# Patient Record
Sex: Male | Born: 1965 | Race: White | Hispanic: No | Marital: Single | Smoking: Never smoker
Health system: Southern US, Academic
[De-identification: ages and names within clinical notes are randomized; demographics above are authoritative.]

## PROBLEM LIST (undated history)

## (undated) DIAGNOSIS — Z87442 Personal history of urinary calculi: Secondary | ICD-10-CM

## (undated) DIAGNOSIS — I10 Essential (primary) hypertension: Secondary | ICD-10-CM

## (undated) DIAGNOSIS — K4090 Unilateral inguinal hernia, without obstruction or gangrene, not specified as recurrent: Secondary | ICD-10-CM

## (undated) HISTORY — PX: INGUINAL HERNIA REPAIR: SUR1180

## (undated) HISTORY — PX: COLONOSCOPY: WVUENDOPRO10

## (undated) HISTORY — DX: Personal history of urinary calculi: Z87.442

## (undated) HISTORY — DX: Unilateral inguinal hernia, without obstruction or gangrene, not specified as recurrent: K40.90

## (undated) HISTORY — DX: Essential (primary) hypertension: I10

---

## 2020-03-15 ENCOUNTER — Ambulatory Visit (HOSPITAL_COMMUNITY): Payer: Self-pay | Admitting: Surgery

## 2021-11-23 IMAGING — MR MRI SHOULDER LT W/O CONTRAST
6 of 7 series · 34 of 40 positions shown · non-contrast
Comparison: None previous.

﻿EXAM:  [id number]   MRI SHOULDER LT W/O CONTRAST
INDICATION: 55-year old male with persistent left shoulder pain and diminished range of motion after sustaining trauma due to fall in May.  No history of shoulder surgery.
TECHNIQUE: Axial, oblique coronal and oblique sagittal images of the left shoulder were obtained as per protocol.

[Series 10: T1 · oblique · left · 4.0mm · 0.31mm/px · 7 of 22 slices shown]
[im 1/22]
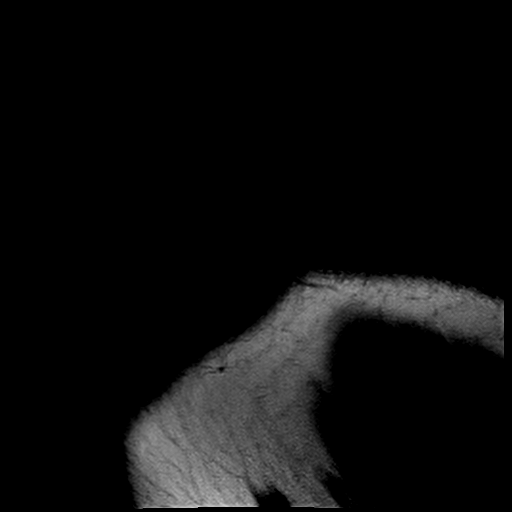
[im 4/22]
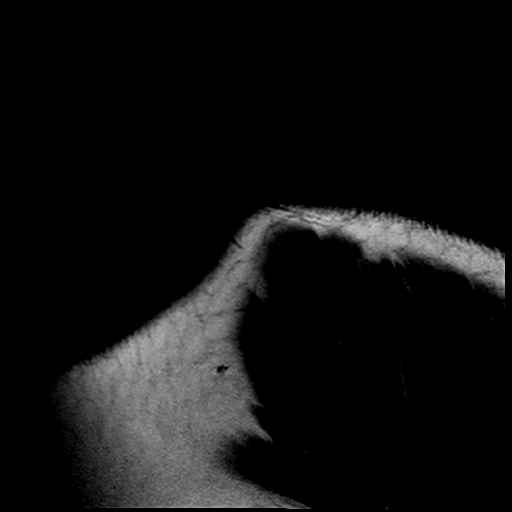
[im 8/22]
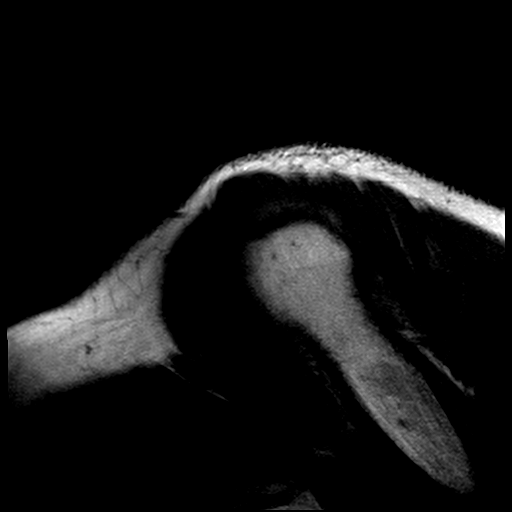
[im 11/22]
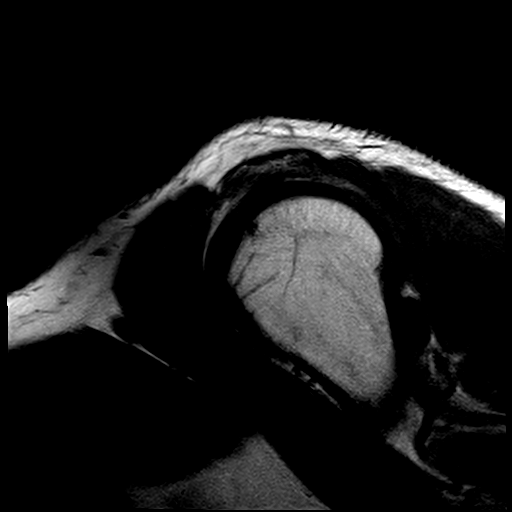
[im 15/22]
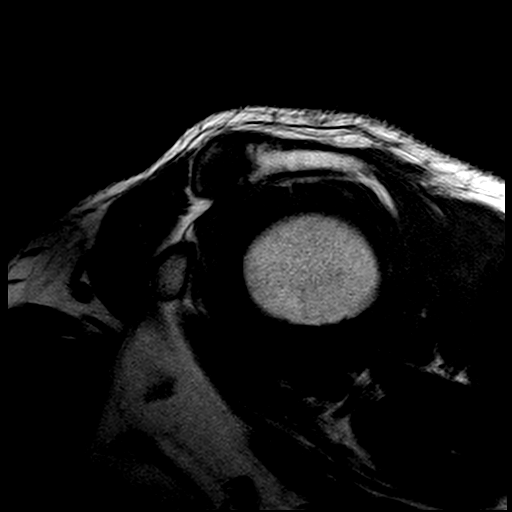
[im 18/22]
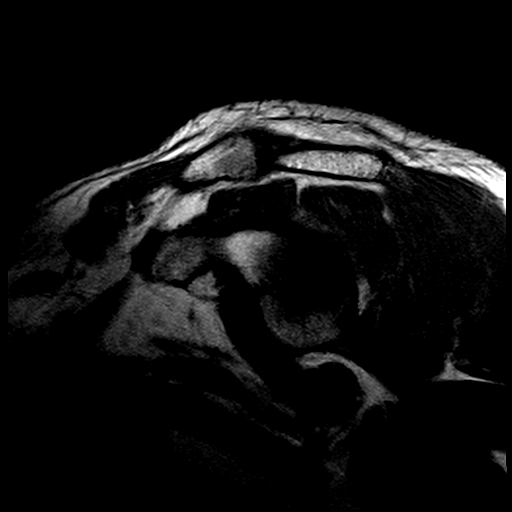
[im 22/22]
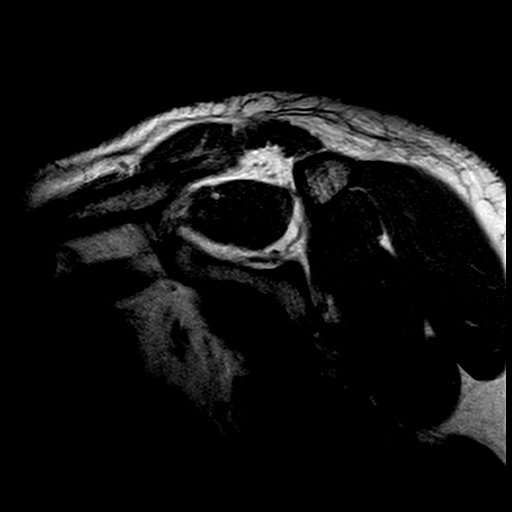

[Series 11: T2 fat-sat · axial · left · 4.0mm · 0.42mm/px · z∈[-92,+11]mm · 7 of 24 slices shown]
[im 1/24]
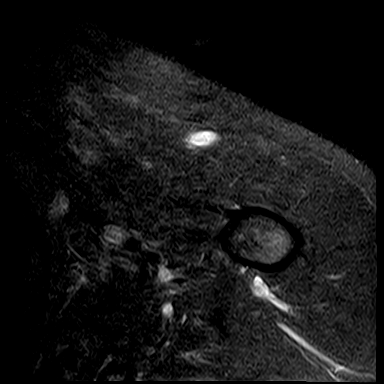
[im 4/24]
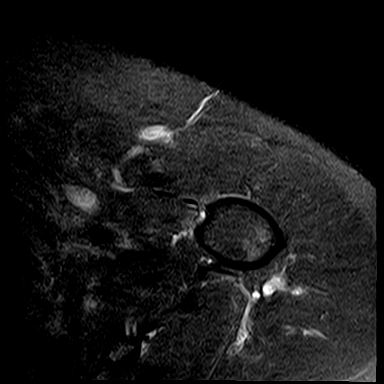
[im 8/24]
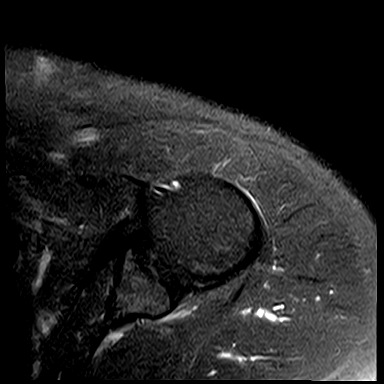
[im 12/24]
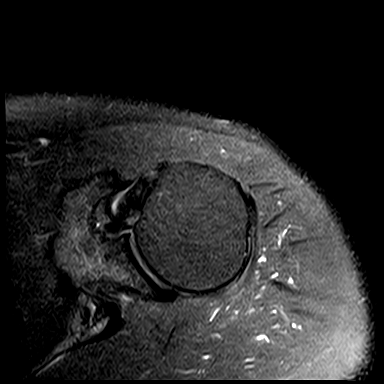
[im 16/24]
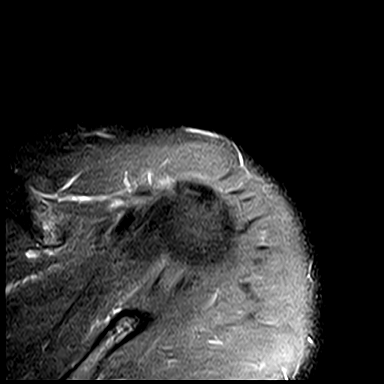
[im 20/24]
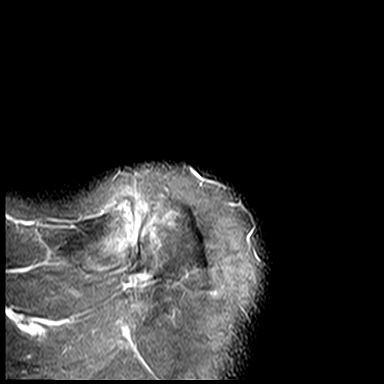
[im 24/24]
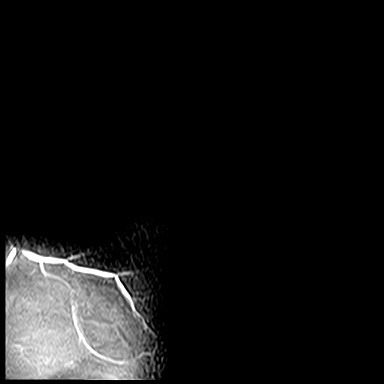

[Series 12: PD fat-sat · axial · left · 4.0mm · 0.50mm/px · z∈[-78,-2]mm · 5 of 18 slices shown (1 of 2)]
[im 1/18]
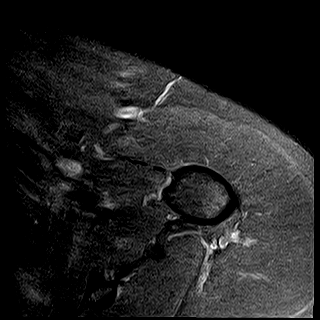
[im 5/18]
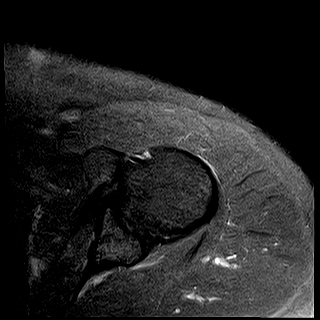
[im 9/18]
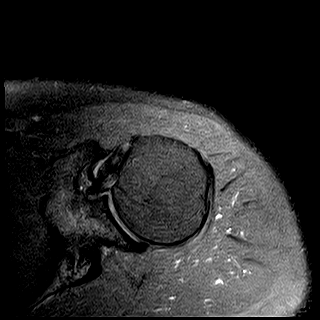
[im 13/18]
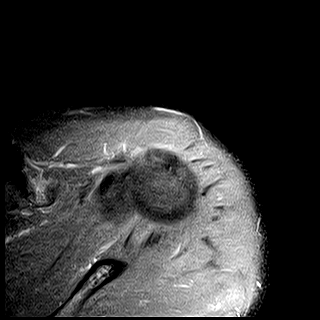
[im 18/18]
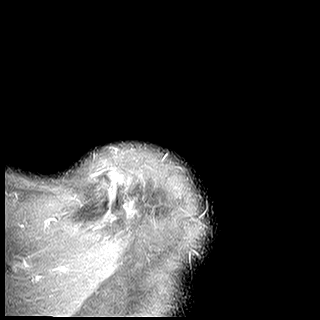

[Series 13: PD fat-sat · oblique · left · 3.5mm · 0.47mm/px · 5 of 18 slices shown (2 of 2)]
[im 1/18]
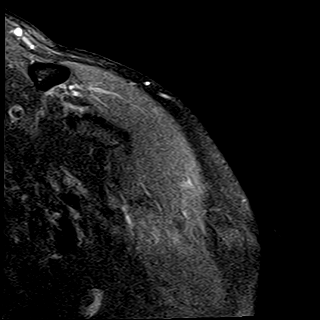
[im 5/18]
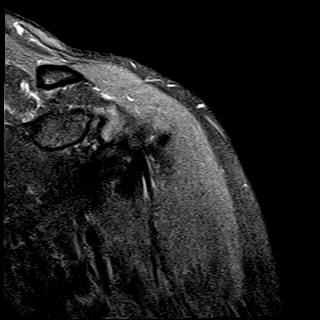
[im 9/18]
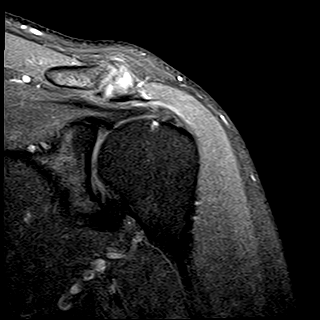
[im 13/18]
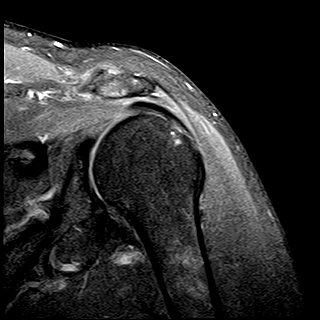
[im 18/18]
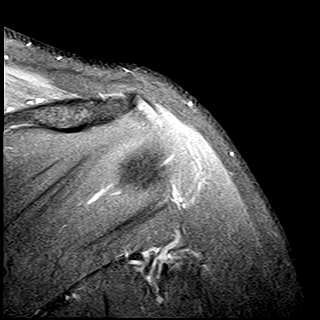

[Series 14: STIR · oblique · left · 3.5mm · 0.47mm/px · 4 of 18 slices shown]
[im 1/18]
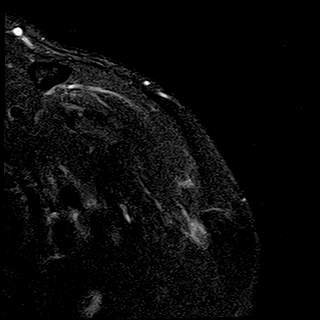
[im 5/18]
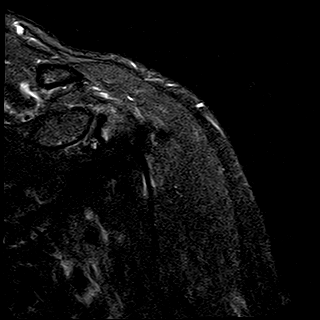
[im 9/18]
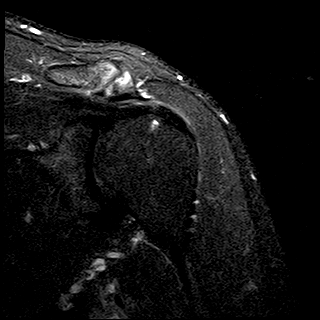
[im 13/18]
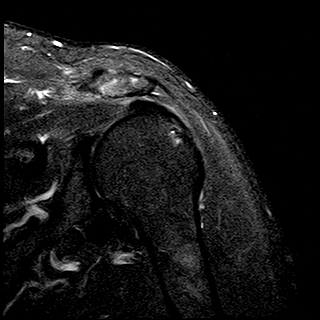

[Series 16: PD · oblique · left · 3.5mm · 0.47mm/px · 6 of 22 slices shown]
[im 1/22]
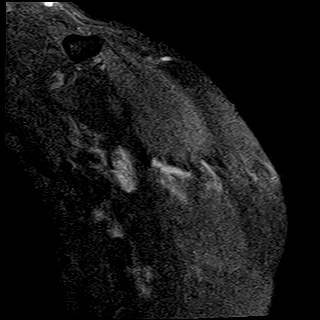
[im 5/22]
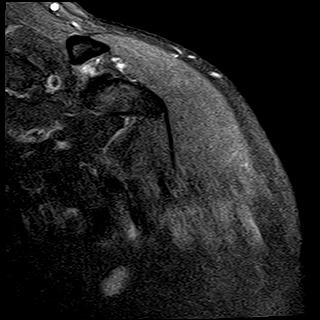
[im 9/22]
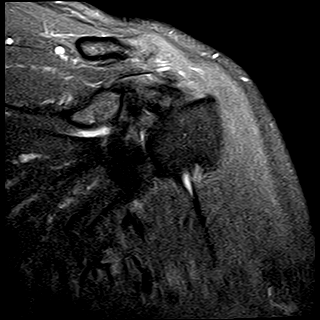
[im 13/22]
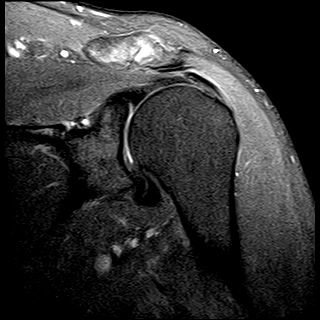
[im 17/22]
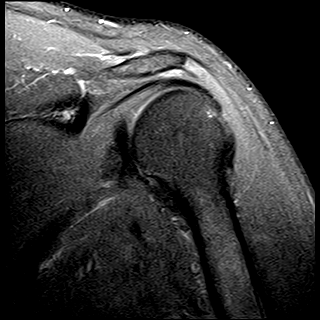
[im 22/22]
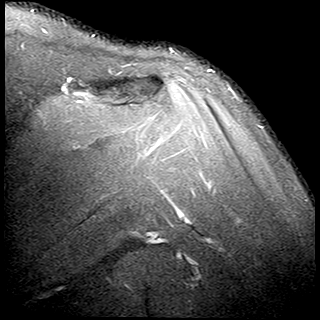

[34 of 40 positions shown; findings below may reference images not displayed]

FINDINGS: No acute fractures are seen at the left shoulder.  Degenerative changes of acromioclavicular joint with moderate inflammatory changes on both sides of the joint mildly impinges on the subacromion space in the supraspinatus.  Supraspinatus tendinopathy with shallow partial thickness tear on the bursal side of the supraspinatus is noted.  No full thickness tear are seen.  

Glenoid labrum and long head of biceps tendon are intact.
IMPRESSION: 1. No acute bony lesions at the left shoulder.  

2. Degenerative changes and inflammatory changes of AC joint mildly impinging on the supraspinatus in the subacromion region.  

3. Supraspinatus tendinopathy with shallow partial thickness tear on the bursal side.  No full thickness tear are seen.  

4. Glenoid labrum and biceps tendon are intact.

## 2022-05-01 IMAGING — MR MRI THORACIC SPINE WITHOUT CONTRAST
4 of 5 series · 26 of 48 positions shown · IV contrast (gadolinium)
Comparison: None available.

﻿EXAM:  [id number]   MRI THORACIC SPINE WITHOUT CONTRAST
INDICATION: Trauma due to fall in September 2021.  Mid back pain.  Radiculopathy in the thoracic region.
TECHNIQUE: Multiplanar, multisequential MRI of the thoracic spine was performed without gadolinium contrast.

[Series 5: T2 · sagittal · 4.0mm · 0.78mm/px · 8 of 13 slices shown (1 of 2)]
[im 1/13]
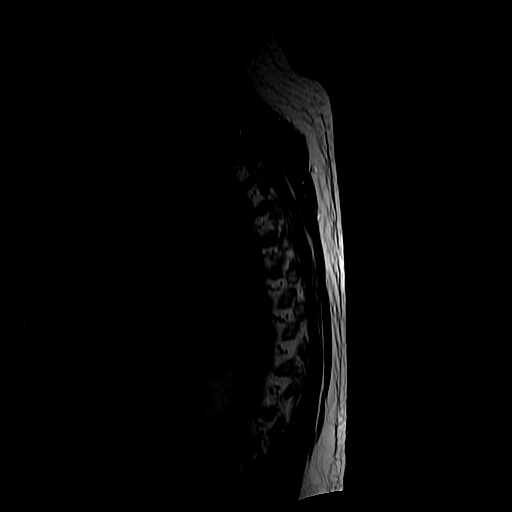
[im 2/13]
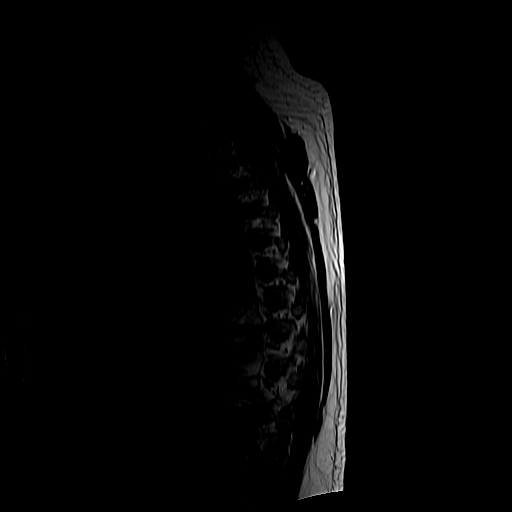
[im 5/13]
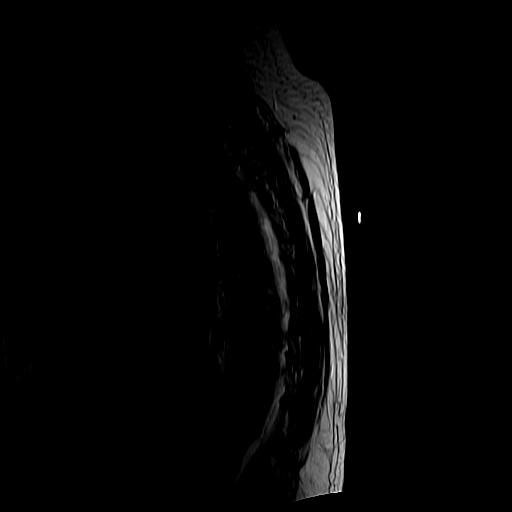
[im 6/13]
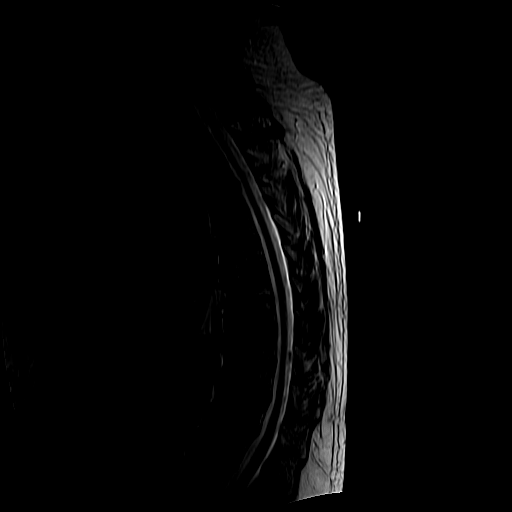
[im 7/13]
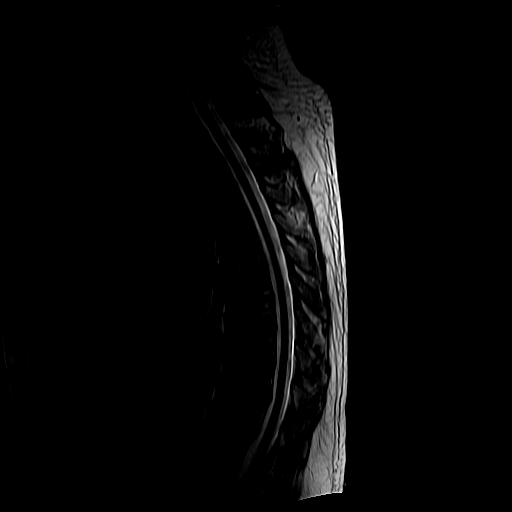
[im 9/13]
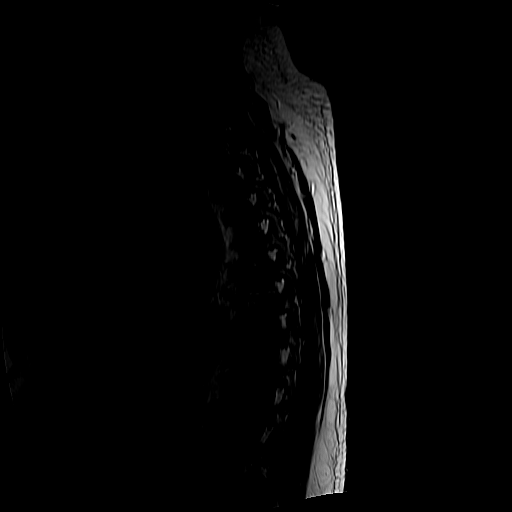
[im 11/13]
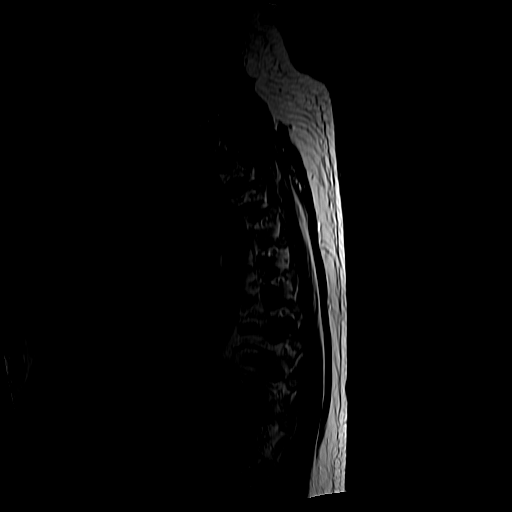
[im 13/13]
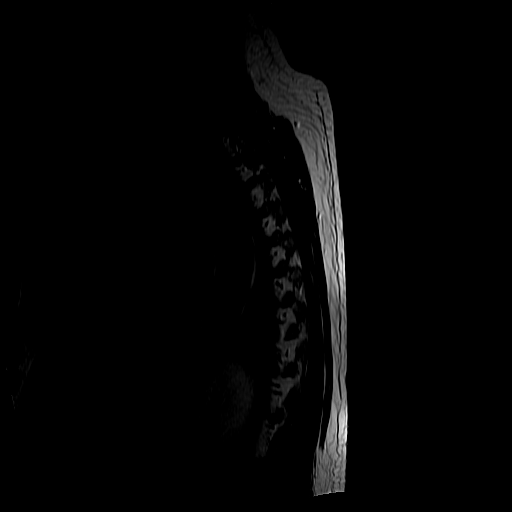

[Series 6: T1 · sagittal · 4.0mm · 0.78mm/px · 6 of 13 slices shown (1 of 2)]
[im 1/13]
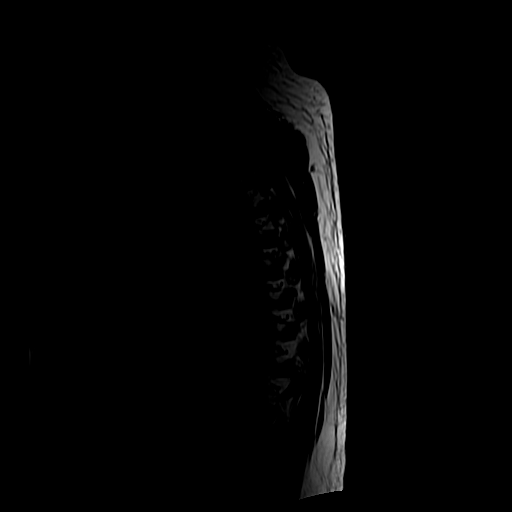
[im 2/13]
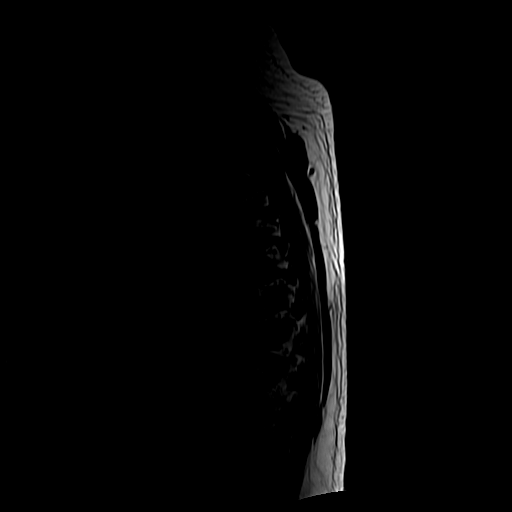
[im 5/13]
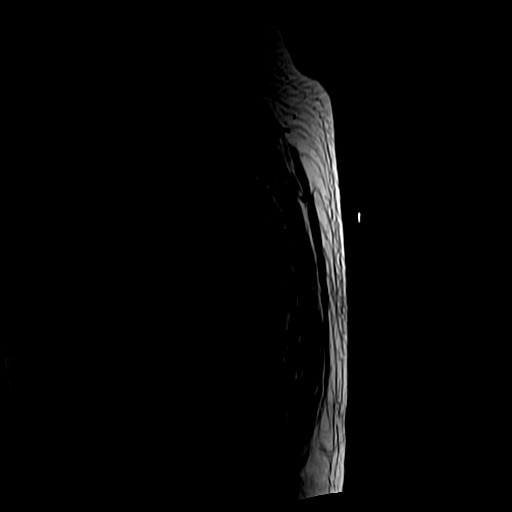
[im 6/13]
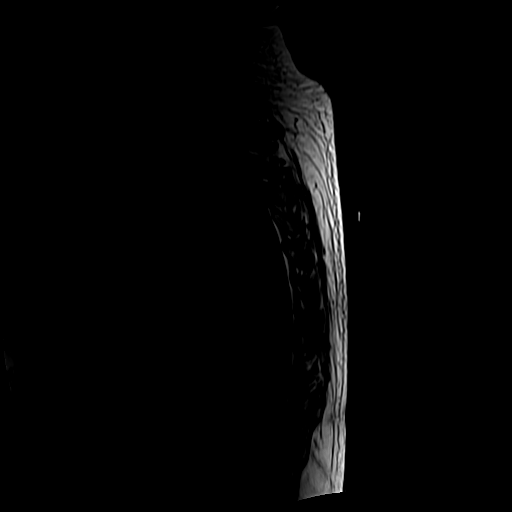
[im 7/13]
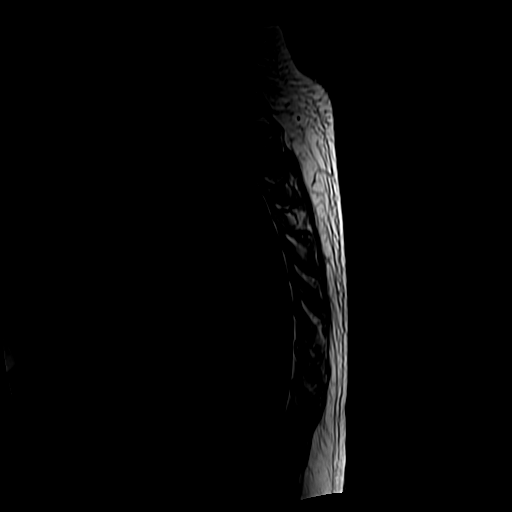
[im 11/13]
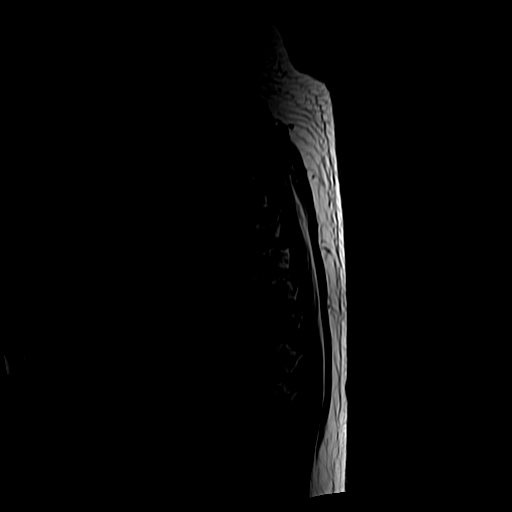

[Series 8: T2 · axial · 4.0mm · 0.62mm/px · z∈[-247,-85]mm · 9 of 12 slices shown (2 of 2)]
[im 1/12]
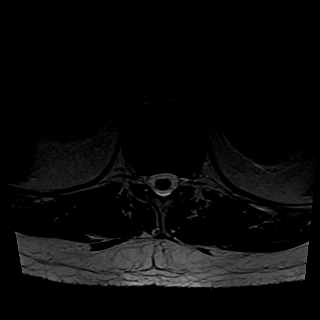
[im 2/12]
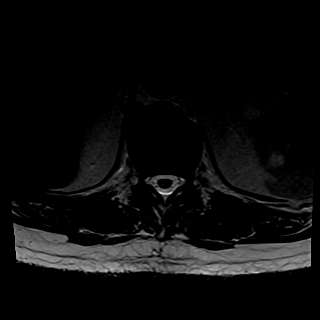
[im 3/12]
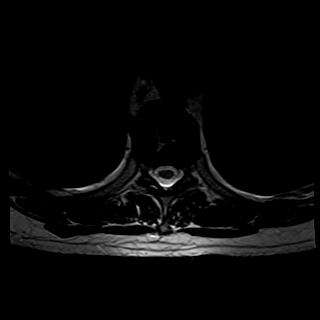
[im 5/12]
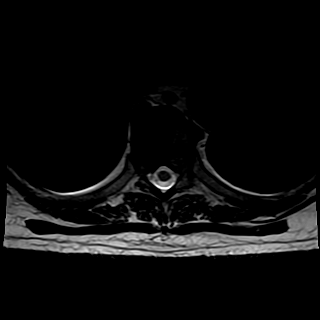
[im 6/12]
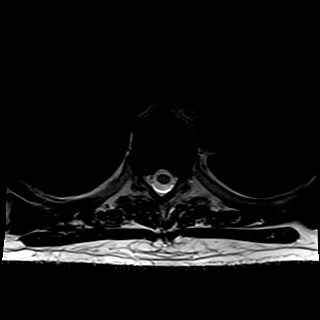
[im 7/12]
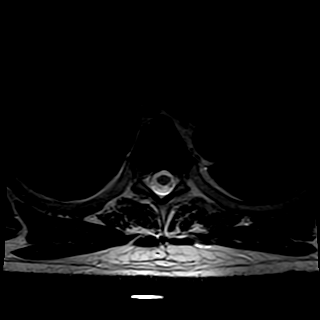
[im 9/12]
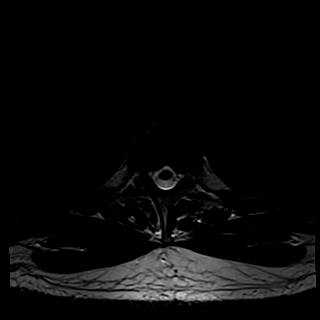
[im 10/12]
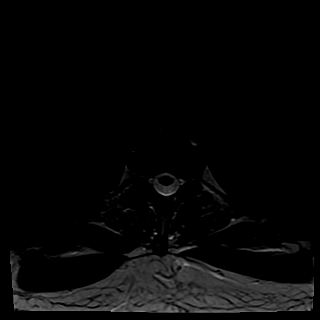
[im 12/12]
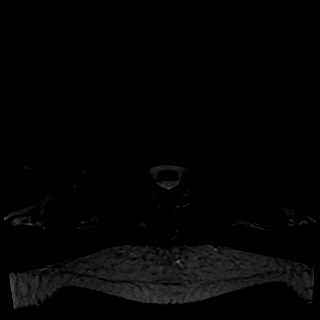

[Series 9: T1 · axial · 4.0mm · 0.62mm/px · z∈[-231,-120]mm · 3 of 12 slices shown (2 of 2)]
[im 2/12]
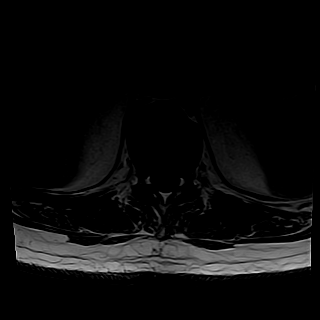
[im 6/12]
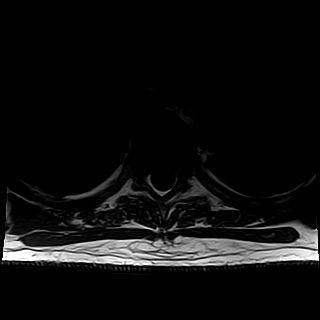
[im 10/12]
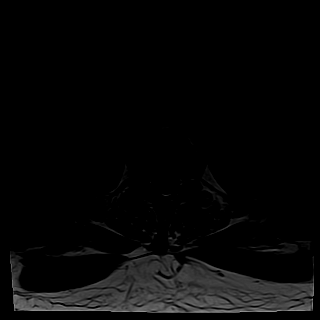

[26 of 48 positions shown; findings below may reference images not displayed]

FINDINGS: No acute bony lesions of thoracic vertebrae are seen.

No evidence of thoracic disc herniation or spinal stenosis is seen in the thoracic spine. 

Moderate degenerative disc changes in the lower thoracic spine extending from T8-9 level up to T12-L1 level are noted.  No significant compromise of neural foramina is seen in the thoracic region.

Thoracic spinal cord shows no focal abnormalities.  Paravertebral soft tissues do not show focal or acute findings.
IMPRESSION: 1. No acute fracture or bone marrow edema of thoracic spine.

2. Moderate multilevel degenerative disc changes in the lower thoracic spine as mentioned above from T8-9 level up to T12-L1 level.

3. No evidence of thoracic spinal stenosis or foraminal stenosis is seen.  Thoracic spinal cord shows no focal abnormalities.

4. Above-mentioned findings are chronic in nature.

## 2022-06-04 ENCOUNTER — Other Ambulatory Visit: Payer: Self-pay

## 2022-06-04 ENCOUNTER — Encounter (RURAL_HEALTH_CENTER): Payer: Self-pay | Admitting: Family Medicine

## 2022-06-04 ENCOUNTER — Ambulatory Visit (RURAL_HEALTH_CENTER): Payer: BC Managed Care – PPO | Attending: Family Medicine | Admitting: Family Medicine

## 2022-06-04 VITALS — BP 140/90 | HR 75 | Temp 98.5°F | Resp 16 | Wt 255.0 lb

## 2022-06-04 DIAGNOSIS — M546 Pain in thoracic spine: Secondary | ICD-10-CM | POA: Insufficient documentation

## 2022-06-04 DIAGNOSIS — M12819 Other specific arthropathies, not elsewhere classified, unspecified shoulder: Secondary | ICD-10-CM

## 2022-06-04 DIAGNOSIS — M12812 Other specific arthropathies, not elsewhere classified, left shoulder: Secondary | ICD-10-CM | POA: Insufficient documentation

## 2022-06-04 DIAGNOSIS — R972 Elevated prostate specific antigen [PSA]: Secondary | ICD-10-CM

## 2022-06-04 DIAGNOSIS — I10 Essential (primary) hypertension: Secondary | ICD-10-CM

## 2022-06-04 MED ORDER — VALSARTAN 80 MG TABLET: ORAL_TABLET | ORAL | 0 refills | Status: DC

## 2022-06-04 NOTE — Nursing Note (Signed)
Pt here to get established .  Nohelani Benning, LPN

## 2022-06-04 NOTE — Progress Notes (Signed)
[facility], [place]  [address]  [place] [address]  Operated by [facility]     Name: George Vincent. MRN:  [id number]   Date: 06/04/2022 Age: 57 y.o.          Provider: Lowella Grip, DO    Reason for visit: New Patient      History of Present Illness:  George Vincent. is a 57 y.o. male here today to establish care.    Essential Hypertension:     Worker's compesnsation Injuries:   Rotator cuff surgery:   Scheduled in March    Thoracic Sprain:   Dr. Milinda Cave in [place] - referred to pain management  If he is upright and not leaning against his back - no problems. If he rotates to the left he feels soreness on the right side. He states he if lays down on the floor he cannot get up on his own.   After sitting in his recliner at the end of the day when he sits in the recliner he is  Bed - goes to sleep ok but if he wakes up then he cannot get the soreness to go away.    Had MRI that was essentially "unremarkable"  05/01/2022: MRI Thoracic Spine:   No acute fracture or bone marrow edema of thoracic spine.   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  No evidence of thoracic spinal stenosis or foraminal stenosis is seen. Thoracic spinal cord shows no focal abnormalities.   Above-mentioned findings are chronic in nature.       Increase in PSA  0.79 to 2.49  He reports family history of cancer.     Labs from 05/08/2022:   Glucose 68  A1C 5.7  Bun 15/Cr 1.08  T. Bilirubin 0.53  Urine is negative  TC 186  HDL 42.2  LDL 129  TG 74        Historical Data    Past Medical History:  Past Medical History:   Diagnosis Date    History of kidney stones     Hypertension     Inguinal hernia          Past Surgical History:  Past Surgical History:   Procedure Laterality Date    COLONOSCOPY      Dr Georgiann Cocker    INGUINAL HERNIA REPAIR      right 03/2016, left 12/2020          Allergies:  No Known Allergies  Medications:  Current Outpatient Medications   Medication Sig    valsartan (DIOVAN) 80 mg Oral Tablet Take 1 Tablet (80 mg total) by mouth Twice daily Takes one tablet morning and 1/2 in evening     Family History:  Family Medical History:       Problem Relation (Age of Onset)    Brain cancer Mother    Diabetes Mother    Hypertension (High Blood Pressure) Mother, Father    Stroke Mother    Thyroid Disease Mother            Social History:  Social History     Socioeconomic History    Marital status: Single   Tobacco Use    Smoking status: Never    Smokeless tobacco: Never   Vaping Use    Vaping Use: Unknown   Substance and Sexual Activity    Alcohol use: Never  Drug use: Never           Review of Systems:  Review of Systems   Constitutional:  Negative for fatigue, fever and unexpected weight change.   Respiratory:  Negative for cough, chest tightness and shortness of breath.    Cardiovascular:  Negative for chest pain and leg swelling.   Gastrointestinal:  Negative for abdominal pain, constipation, diarrhea, nausea and vomiting.   Musculoskeletal:  Positive for arthralgias and back pain.   Neurological:  Negative for light-headedness and headaches.         Physical Exam:  Vital Signs:  Vitals:    06/04/22 1631 06/04/22 1653   BP: (!) 128/98 (!) 140/90  Comment: manual   Pulse: 75    Resp: 16    Temp: 36.9 C (98.5 F)    TempSrc: Skin    SpO2: 96%    Weight: 116 kg (255 lb)      Physical Exam  Vitals reviewed.   Constitutional:       General: He is not in acute distress.     Appearance: Normal appearance.   HENT:      Mouth/Throat:      Mouth: Mucous membranes are moist.   Eyes:      Pupils: Pupils are equal, round, and reactive to light.   Cardiovascular:      Rate and Rhythm: Normal rate and regular rhythm.      Pulses: Normal pulses.      Heart sounds: Normal heart sounds. No murmur heard.  Pulmonary:      Effort: Pulmonary effort is normal.      Breath sounds: Normal  breath sounds.   Abdominal:      General: Bowel sounds are normal.      Palpations: Abdomen is soft.      Tenderness: There is no abdominal tenderness. There is no guarding.   Musculoskeletal:         General: Tenderness (thoracic spine) present. No swelling or deformity.      Cervical back: Neck supple.   Lymphadenopathy:      Cervical: No cervical adenopathy.   Skin:     General: Skin is warm and dry.   Neurological:      General: No focal deficit present.      Mental Status: He is alert.             Assessment:    ICD-10-CM    1. Essential hypertension  I10 valsartan (DIOVAN) 80 mg Oral Tablet      2. Thoracic back pain  M54.6       3. Elevated PSA  R97.20       4. Rotator cuff arthropathy, unspecified laterality  M12.819             Plan:    Depression screening is negative. PHQ 2 Total: 0     1. Essential hypertension  Home BP monitoring reported at improved and readings from work physical also reviewed and improved compared to here. We will continue at current doses with home monitoring reported but if persistently elevated then we will need to look at increasing.     - valsartan (DIOVAN) 80 mg Oral Tablet; Takes one tablet morning and 1/2 in evening  Dispense: 180 Tablet; Refill: 0    2. Thoracic back pain  MRI reviewed. We will continue to monitor - discussed evaluation by chiropractor and/or OMT specialist if pain persists. Referral for pain management made by Dr. Milinda Cave.  3. Elevated PSA  Still within the normal limits but did demonstrate an increase. Will continue to monitor closely.     4. Rotator cuff arthropathy, unspecified laterality  Continue to follow with orthopedics.       Return in about 6 months (around 12/03/2022) for In Person Visit - CDM.    Lowella Grip, DO     Portions of this note may be dictated using voice recognition software or a dictation service. Variances in spelling and vocabulary are possible and unintentional. Not all errors are caught/corrected. Please notify the [facility] if any  discrepancies are noted or if the meaning of any statement is not clear.

## 2022-06-06 ENCOUNTER — Encounter (RURAL_HEALTH_CENTER): Payer: Self-pay | Admitting: Family Medicine

## 2022-06-06 ENCOUNTER — Other Ambulatory Visit (RURAL_HEALTH_CENTER): Payer: Self-pay | Admitting: Family Medicine

## 2022-06-07 MED ORDER — SILVER SULFADIAZINE 1 % TOPICAL CREAM
TOPICAL_CREAM | Freq: Every day | CUTANEOUS | 1 refills | Status: DC
Start: 2022-06-07 — End: 2022-08-06

## 2022-06-12 ENCOUNTER — Other Ambulatory Visit: Payer: Self-pay

## 2022-07-23 ENCOUNTER — Other Ambulatory Visit: Payer: Worker's Comp, Other unspecified | Attending: Orthopaedic Surgery

## 2022-07-23 ENCOUNTER — Inpatient Hospital Stay (HOSPITAL_BASED_OUTPATIENT_CLINIC_OR_DEPARTMENT_OTHER)
Admission: RE | Admit: 2022-07-23 | Discharge: 2022-07-23 | Disposition: A | Discharge status: Home / Self Care | Payer: Worker's Comp, Other unspecified | Source: Ambulatory Visit | Attending: Orthopaedic Surgery | Admitting: Orthopaedic Surgery

## 2022-07-23 ENCOUNTER — Other Ambulatory Visit: Payer: Self-pay

## 2022-07-23 ENCOUNTER — Other Ambulatory Visit (HOSPITAL_COMMUNITY): Payer: Self-pay | Admitting: Orthopaedic Surgery

## 2022-07-23 DIAGNOSIS — Z01818 Encounter for other preprocedural examination: Secondary | ICD-10-CM

## 2022-07-23 LAB — URINALYSIS, MACROSCOPIC
BILIRUBIN: NEGATIVE mg/dL
BLOOD: NEGATIVE mg/dL
GLUCOSE: NEGATIVE mg/dL
KETONES: NEGATIVE mg/dL
LEUKOCYTES: NEGATIVE WBCs/uL
NITRITE: NEGATIVE
PH: 5 (ref 5.0–9.0)
PROTEIN: NEGATIVE mg/dL
SPECIFIC GRAVITY: 1.016 (ref 1.002–1.030)
UROBILINOGEN: NORMAL mg/dL

## 2022-07-23 LAB — CBC WITH DIFF
BASOPHIL #: 0.1 10*3/uL (ref 0.00–0.10)
BASOPHIL %: 1 % (ref 0–1)
EOSINOPHIL #: 0.4 10*3/uL (ref 0.00–0.50)
EOSINOPHIL %: 5 %
HCT: 41.5 % (ref 36.7–47.1)
HGB: 14.4 g/dL (ref 12.5–16.3)
LYMPHOCYTE #: 2.9 10*3/uL (ref 1.00–3.00)
LYMPHOCYTE %: 36 % (ref 16–44)
MCH: 32.9 pg (ref 23.8–33.4)
MCHC: 34.8 g/dL (ref 32.5–36.3)
MCV: 94.5 fL (ref 73.0–96.2)
MONOCYTE #: 0.7 10*3/uL (ref 0.30–1.00)
MONOCYTE %: 9 % (ref 5–13)
MPV: 8.8 fL (ref 7.4–11.4)
NEUTROPHIL #: 4.2 10*3/uL (ref 1.85–7.80)
NEUTROPHIL %: 51 % (ref 43–77)
PLATELETS: 240 10*3/uL (ref 140–440)
RBC: 4.39 10*6/uL (ref 4.06–5.63)
RDW: 14.1 % (ref 12.1–16.2)
WBC: 8.2 10*3/uL (ref 3.6–10.2)

## 2022-07-23 LAB — BASIC METABOLIC PANEL
ANION GAP: 5 mmol/L (ref 4–13)
BUN/CREA RATIO: 14 (ref 6–22)
BUN: 15 mg/dL (ref 7–25)
CALCIUM: 8.9 mg/dL (ref 8.6–10.3)
CHLORIDE: 107 mmol/L (ref 98–107)
CO2 TOTAL: 29 mmol/L (ref 21–31)
CREATININE: 1.07 mg/dL (ref 0.60–1.30)
ESTIMATED GFR: 81 mL/min/{1.73_m2} (ref >59)
GLUCOSE: 100 mg/dL (ref 74–109)
OSMOLALITY, CALCULATED: 282 mOsm/kg (ref 270–290)
POTASSIUM: 3.7 mmol/L (ref 3.5–5.1)
SODIUM: 141 mmol/L (ref 136–145)

## 2022-07-23 LAB — URINALYSIS, MICROSCOPIC
RBCS: <1 /hpf (ref <4)
WBCS: 1 /hpf (ref <6)

## 2022-07-24 DIAGNOSIS — Z01810 Encounter for preprocedural cardiovascular examination: Secondary | ICD-10-CM

## 2022-07-24 LAB — ECG 12 LEAD
Atrial Rate: 67 {beats}/min
Calculated P Axis: -14 degrees
Calculated R Axis: 46 degrees
Calculated T Axis: 42 degrees
PR Interval: 134 ms
QRS Duration: 88 ms
QT Interval: 392 ms
QTC Calculation: 414 ms
Ventricular rate: 67 {beats}/min

## 2022-08-05 NOTE — H&P (Signed)
George Vincent  [id number]        CHIEF COMPLAINT  LEFT Shoulder pain; 03/19/22; GS / PJB     HISTORY  57 year old male suffered compensable injury 09/25/2021. Patient states he was at work slipped on some gravel fell forward. He had some right wrist pain he had bruising swelling to the right forearm. Also complains of mid ba ck pain. Couple days later developed left shoulder pain he is here today primarily for shoulder pain. Complains of should er pain that is increased with raising the arm away from body and overhead. He has had MRI of the shoulder . He has been treat ed with injection and 2 months of physical therapy. Initially he felt that the injection helped with pain. Continues to have shoulder pain on a daily basis interfering with activities. MRI showed partial rotator cuff tear and some impingement.     PAST MEDICAL HISTORY  Denies history of cancer Cardiac: Hypertension Musculoskeletal: Denies OA, gout and other common musculoskeletal problems. Rheumatologic: Denies N/A     PAST SURGICAL HISTORY  Date Side/ Site Procedure Details Doctor Facility Complications [id number] Hernia surgery     ALLERGIES  No allergies     MEDICATIONS  Name Diovan ------Angiotensin II Receptor Blockers (ARBs) Strength QTY Sig 40 mg 1 qd  Date 2021-12-25     Family and Social History  Patient is Married. Non-smoker Recode: 4 ETOH: None Mother deceased age 40; Father: deceased age 48,causeofdeath. No brothers; No sisters. Diabetes in mother. Hypertension in mother, father and brother Cholesterol mother Stroke in mother No known family history of MI, Bleeding, Blood clots, Neurologic conditions     REVIEW OF SYSTEMS  General: No history of change in weight, fever, chills, change in energy, fatigue or changes in sleep habits. Opthalmic: Negative for change in vision / eye problems. ENT: Denies congestion, sinus, throat and hearing issues. Respiratory: Denies SOB, cough and other respiratory symptoms. Cardiac: Denies chest pain, CHF and  other cardiac symptoms. GI: No changes in bowel habits, abdominal pain or other GI complaint. Male GU: Denies significant male genitourinary symptoms. Musculoskeletal: Denies significant pain, joint or muscular symptoms. Neurological: Denies cephalgia, seizures, weakness, numbness and other neurologic symptoms. Endocrine: Denies polyuria, polydypsia and other endocrine related complaints. Behavioral: Denies significant mood and psychological symptoms. Hemeatologic: Denies anemia, bleeding / clotting problems and other blood related complaints. Stop Bang interpretation / modified: Low probability of OSA: Score 2 RCRI, no risk factors, risk of cardiac death, nonfatal MI and cardiac arrest, 0.4%. Risk of MI, Pulmonary edema, ventricular fib, cardiac arrest and complete heart block 0.5% POUR / IPSS score 0. Score classified as mild level of symptoms, with no or mild increase in risk of urinary retention. ASA I : Healthy Charlson score: Enter [place] then select calculate score resulting in estimated in hospital mortality rate of 1.2%     EXAM  Left shoulder range of motion full strength is intact markedly positive impingement mild tenderness over the [facility] joint bicep tendon is nontender     X-RAYS  Date Name Review 11/23/2021 L Shoulder MR Review of outside study & report. Acromioclavicular joint unremarkable . Biceps long head normal. Labrum: normal. Ligaments: Normal. Synovial: No significant effusion, synovitis or loose bodies noted . No significant glenohumeral arthritis. no acute bony lesions at the left shoulder. degerative changes and inflammatory changes of AC joint mildly impinging on the supraspinatus in the subacromion regon. supreaspinatus tendinopathy with shallow partial thickness tear on the bursal side. no full thickness tear  are seen. glenoid labrum and biceps tendon are intact     Other Treatments reviewed  Date Name Review     ASSESSMENT  left shoulder partial cuff tear impingement     Diagnosis  Chical  Diagnosis  left shoulder partial cuff tear impingement     RECOMMENDATION  Treatment: again we reviewed MRI of the shoulder. We discussed conservative as well as surgical intervention. At this point he has had failure conservative care. Due to failure conservative care he wishes to proceed with art hroscopic evaluation of the shoulder with subacromial decompression. We discussed the procedure we discussed risk factors neurovascular compromise infection surgical informed consent was obtained. Patient was also seen evaluated Dr. Marcelino Scot  Orders:   Date Name Review 11/23/2021 L Shoulder MR Review of outside study & report. Acromioclavicular joint unremarkable . Biceps long head normal. Labrum: normal. Ligaments: Normal. Synovial: No significant effusion, synovitis or loose bodies noted . No significant glenohumeral arthritis. no acute bony lesions at the left shoulder. degerative changes and inflammatory changes of AC joint mildly impinging on the supraspinatus in the subacromion regon. supreaspinatus tendinopathy with shallow partial thickness tear on the bursal side. no full thickness tear are seen. glenoid labrum and biceps tendon are intact  Date Name Review 02/18/2022 L [place] N/A 12/25/2021 L [place] / FOLLOW-UP  proceed with scheduling surgery      Karsten Ro, MD/ Talbert Forest, PA-C   Attestation:  Patient has been examined before anesthesia, and there are no changes in medical status or indications for surgery.

## 2022-08-06 ENCOUNTER — Other Ambulatory Visit: Payer: Self-pay

## 2022-08-06 ENCOUNTER — Ambulatory Visit (HOSPITAL_COMMUNITY): Payer: Worker's Comp, Other unspecified | Admitting: Anesthesiology

## 2022-08-06 ENCOUNTER — Inpatient Hospital Stay
Admission: RE | Admit: 2022-08-06 | Discharge: 2022-08-06 | Disposition: A | Discharge status: Home / Self Care | Payer: Worker's Comp, Other unspecified | Source: Ambulatory Visit | Attending: Orthopaedic Surgery | Admitting: Orthopaedic Surgery

## 2022-08-06 ENCOUNTER — Encounter (HOSPITAL_COMMUNITY)
Admission: RE | Disposition: A | Discharge status: Home / Self Care | Payer: Self-pay | Source: Ambulatory Visit | Attending: Orthopaedic Surgery

## 2022-08-06 ENCOUNTER — Encounter (HOSPITAL_COMMUNITY): Payer: Worker's Comp, Other unspecified | Admitting: Orthopaedic Surgery

## 2022-08-06 ENCOUNTER — Encounter (HOSPITAL_COMMUNITY): Payer: Self-pay | Admitting: Orthopaedic Surgery

## 2022-08-06 DIAGNOSIS — Y990 Civilian activity done for income or pay: Secondary | ICD-10-CM | POA: Insufficient documentation

## 2022-08-06 DIAGNOSIS — M25512 Pain in left shoulder: Secondary | ICD-10-CM | POA: Insufficient documentation

## 2022-08-06 DIAGNOSIS — W010XXA Fall on same level from slipping, tripping and stumbling without subsequent striking against object, initial encounter: Secondary | ICD-10-CM | POA: Insufficient documentation

## 2022-08-06 DIAGNOSIS — M25812 Other specified joint disorders, left shoulder: Secondary | ICD-10-CM | POA: Insufficient documentation

## 2022-08-06 DIAGNOSIS — E669 Obesity, unspecified: Secondary | ICD-10-CM | POA: Insufficient documentation

## 2022-08-06 DIAGNOSIS — I10 Essential (primary) hypertension: Secondary | ICD-10-CM | POA: Insufficient documentation

## 2022-08-06 SURGERY — ARTHROSCOPY SHOULDER
Anesthesia: General | Site: Shoulder | Laterality: Left | Wound class: Clean Wound: Uninfected operative wounds in which no inflammation occurred

## 2022-08-06 MED ORDER — ACETAMINOPHEN 325 MG TABLET
975.0000 mg | ORAL_TABLET | Freq: Once | ORAL | Status: AC
Start: 2022-08-06 — End: 2022-08-06
  Administered 2022-08-06: 975 mg via ORAL

## 2022-08-06 MED ORDER — MIDAZOLAM 5 MG/ML INJECTION WRAPPER
INTRAMUSCULAR | Status: AC
Start: 2022-08-06 — End: 2022-08-06
  Filled 2022-08-06: qty 1

## 2022-08-06 MED ORDER — CEFAZOLIN 1 GRAM SOLUTION FOR INJECTION
Freq: Once | INTRAMUSCULAR | Status: DC | PRN
Start: 2022-08-06 — End: 2022-08-06
  Administered 2022-08-06: 2000 mg via INTRAVENOUS

## 2022-08-06 MED ORDER — ALBUTEROL SULFATE 2.5 MG/3 ML (0.083 %) SOLUTION FOR NEBULIZATION
2.5000 mg | INHALATION_SOLUTION | Freq: Once | RESPIRATORY_TRACT | Status: DC | PRN
Start: 2022-08-06 — End: 2022-08-06

## 2022-08-06 MED ORDER — DEXAMETHASONE SODIUM PHOSPHATE 4 MG/ML INJECTION SOLUTION
4.0000 mg | Freq: Once | INTRAMUSCULAR | Status: DC
Start: 2022-08-06 — End: 2022-08-06

## 2022-08-06 MED ORDER — SODIUM CHLORIDE 0.9 % (FLUSH) INJECTION SYRINGE
3.0000 mL | INJECTION | Freq: Three times a day (TID) | INTRAMUSCULAR | Status: DC
Start: 2022-08-06 — End: 2022-08-06

## 2022-08-06 MED ORDER — OXYCODONE ER 10 MG TABLET,CRUSH RESISTANT,EXTENDED RELEASE 12 HR
EXTENDED_RELEASE_ORAL_TABLET | ORAL | Status: AC
Start: 2022-08-06 — End: 2022-08-06
  Filled 2022-08-06: qty 1

## 2022-08-06 MED ORDER — CEFAZOLIN 1 GRAM SOLUTION FOR INJECTION
2.0000 g | Freq: Once | INTRAMUSCULAR | Status: DC
Start: 2022-08-06 — End: 2022-08-06

## 2022-08-06 MED ORDER — MORPHINE 10 MG/ML INJECTION WRAPPER
INTRAVENOUS | Status: AC
Start: 2022-08-06 — End: 2022-08-06
  Filled 2022-08-06: qty 2

## 2022-08-06 MED ORDER — SODIUM CHLORIDE 0.9 % (FLUSH) INJECTION SYRINGE
3.0000 mL | INJECTION | INTRAMUSCULAR | Status: DC | PRN
Start: 2022-08-06 — End: 2022-08-06

## 2022-08-06 MED ORDER — LACTATED RINGERS INTRAVENOUS SOLUTION
INTRAVENOUS | Status: DC
Start: 2022-08-06 — End: 2022-08-06

## 2022-08-06 MED ORDER — ONDANSETRON HCL (PF) 4 MG/2 ML INJECTION SOLUTION
4.0000 mg | Freq: Once | INTRAMUSCULAR | Status: DC | PRN
Start: 2022-08-06 — End: 2022-08-06

## 2022-08-06 MED ORDER — PROCHLORPERAZINE EDISYLATE 10 MG/2 ML (5 MG/ML) INJECTION SOLUTION
5.0000 mg | Freq: Once | INTRAMUSCULAR | Status: DC | PRN
Start: 2022-08-06 — End: 2022-08-06

## 2022-08-06 MED ORDER — GABAPENTIN 300 MG CAPSULE
300.0000 mg | ORAL_CAPSULE | Freq: Once | ORAL | Status: DC
Start: 2022-08-06 — End: 2022-08-06

## 2022-08-06 MED ORDER — OXYCODONE-ACETAMINOPHEN 5 MG-325 MG TABLET
1.0000 | ORAL_TABLET | ORAL | Status: DC | PRN
Start: 2022-08-06 — End: 2022-08-06
  Administered 2022-08-06: 1 via ORAL
  Filled 2022-08-06: qty 1

## 2022-08-06 MED ORDER — ROPIVACAINE (PF) 2 MG/ML (0.2 %) INJECTION SOLUTION
Freq: Once | INTRAMUSCULAR | Status: DC | PRN
Start: 2022-08-06 — End: 2022-08-06

## 2022-08-06 MED ORDER — OXYCODONE ER 10 MG TABLET,CRUSH RESISTANT,EXTENDED RELEASE 12 HR
10.0000 mg | EXTENDED_RELEASE_ORAL_TABLET | Freq: Two times a day (BID) | ORAL | Status: DC
Start: 2022-08-06 — End: 2022-08-06
  Administered 2022-08-06: 10 mg via ORAL

## 2022-08-06 MED ORDER — NAPROXEN 250 MG TABLET
ORAL_TABLET | ORAL | Status: AC
Start: 2022-08-06 — End: 2022-08-06
  Filled 2022-08-06: qty 2

## 2022-08-06 MED ORDER — OXYCODONE ER 10 MG TABLET,CRUSH RESISTANT,EXTENDED RELEASE 12 HR
20.0000 mg | EXTENDED_RELEASE_ORAL_TABLET | Freq: Two times a day (BID) | ORAL | Status: DC
Start: 2022-08-06 — End: 2022-08-06

## 2022-08-06 MED ORDER — FAMOTIDINE (PF) 20 MG/2 ML INTRAVENOUS SOLUTION
INTRAVENOUS | Status: AC
Start: 2022-08-06 — End: 2022-08-06
  Filled 2022-08-06: qty 2

## 2022-08-06 MED ORDER — ONDANSETRON HCL (PF) 4 MG/2 ML INJECTION SOLUTION
4.0000 mg | Freq: Once | INTRAMUSCULAR | Status: DC
Start: 2022-08-06 — End: 2022-08-06

## 2022-08-06 MED ORDER — DEXAMETHASONE SODIUM PHOSPHATE (PF) 10 MG/ML INJECTION SOLUTION
10.0000 mg | Freq: Once | INTRAMUSCULAR | Status: AC
Start: 2022-08-06 — End: 2022-08-06
  Administered 2022-08-06: 10 mg via INTRAVENOUS

## 2022-08-06 MED ORDER — ROPIVACAINE (PF) 2 MG/ML (0.2 %) INJECTION SOLUTION
INTRAMUSCULAR | Status: AC
Start: 2022-08-06 — End: 2022-08-06
  Filled 2022-08-06: qty 100

## 2022-08-06 MED ORDER — APREPITANT 40 MG CAPSULE
40.0000 mg | ORAL_CAPSULE | Freq: Once | ORAL | Status: AC
Start: 2022-08-06 — End: 2022-08-06
  Administered 2022-08-06: 40 mg via ORAL

## 2022-08-06 MED ORDER — NALOXONE 0.4 MG/ML INJECTION SOLUTION
0.4000 mg | INTRAMUSCULAR | Status: DC | PRN
Start: 2022-08-06 — End: 2022-08-06

## 2022-08-06 MED ORDER — LACTATED RINGERS INTRAVENOUS SOLUTION
INTRAVENOUS | Status: DC
Start: 2022-08-06 — End: 2022-08-06
  Administered 2022-08-06: 0 via INTRAVENOUS

## 2022-08-06 MED ORDER — APREPITANT 40 MG CAPSULE
ORAL_CAPSULE | ORAL | Status: AC
Start: 2022-08-06 — End: 2022-08-06
  Filled 2022-08-06: qty 1

## 2022-08-06 MED ORDER — MORPHINE 10 MG/ML INJECTION WRAPPER
Freq: Once | INTRAVENOUS | Status: DC | PRN
Start: 2022-08-06 — End: 2022-08-06

## 2022-08-06 MED ORDER — CEFAZOLIN 1 GRAM SOLUTION FOR INJECTION
INTRAMUSCULAR | Status: AC
Start: 2022-08-06 — End: 2022-08-06
  Filled 2022-08-06: qty 20

## 2022-08-06 MED ORDER — ACETAMINOPHEN 325 MG TABLET
ORAL_TABLET | ORAL | Status: AC
Start: 2022-08-06 — End: 2022-08-06
  Filled 2022-08-06: qty 3

## 2022-08-06 MED ORDER — MIDAZOLAM 5 MG/ML INJECTION WRAPPER
2.0000 mg | Freq: Once | INTRAMUSCULAR | Status: DC | PRN
Start: 2022-08-06 — End: 2022-08-06
  Administered 2022-08-06: 0 mg via INTRAVENOUS

## 2022-08-06 MED ORDER — ROCURONIUM 10 MG/ML INTRAVENOUS SOLUTION
Freq: Once | INTRAVENOUS | Status: DC | PRN
Start: 2022-08-06 — End: 2022-08-06
  Administered 2022-08-06: 50 mg via INTRAVENOUS

## 2022-08-06 MED ORDER — FENTANYL (PF) 50 MCG/ML INJECTION SOLUTION
INTRAMUSCULAR | Status: AC
Start: 2022-08-06 — End: 2022-08-06
  Filled 2022-08-06: qty 2

## 2022-08-06 MED ORDER — FENTANYL (PF) 50 MCG/ML INJECTION WRAPPER
25.0000 ug | INJECTION | INTRAMUSCULAR | Status: DC | PRN
Start: 2022-08-06 — End: 2022-08-06

## 2022-08-06 MED ORDER — FENTANYL (PF) 50 MCG/ML INJECTION WRAPPER
50.0000 ug | INJECTION | INTRAMUSCULAR | Status: DC | PRN
Start: 2022-08-06 — End: 2022-08-06

## 2022-08-06 MED ORDER — ROPIVACAINE (PF) 2 MG/ML (0.2 %) INJECTION SOLUTION
INTRAMUSCULAR | Status: AC
Start: 2022-08-06 — End: 2022-08-06
  Filled 2022-08-06: qty 20

## 2022-08-06 MED ORDER — FAMOTIDINE (PF) 20 MG/2 ML INTRAVENOUS SOLUTION
20.0000 mg | Freq: Once | INTRAVENOUS | Status: DC
Start: 2022-08-06 — End: 2022-08-06

## 2022-08-06 MED ORDER — ONDANSETRON HCL (PF) 4 MG/2 ML INJECTION SOLUTION
INTRAMUSCULAR | Status: AC
Start: 2022-08-06 — End: 2022-08-06
  Filled 2022-08-06: qty 4

## 2022-08-06 MED ORDER — FENTANYL (PF) 50 MCG/ML INJECTION WRAPPER
INJECTION | Freq: Once | INTRAMUSCULAR | Status: DC | PRN
Start: 2022-08-06 — End: 2022-08-06
  Administered 2022-08-06: 100 ug via INTRAVENOUS
  Administered 2022-08-06: 50 ug via INTRAVENOUS

## 2022-08-06 MED ORDER — IPRATROPIUM 0.5 MG-ALBUTEROL 3 MG (2.5 MG BASE)/3 ML NEBULIZATION SOLN
3.0000 mL | INHALATION_SOLUTION | Freq: Once | RESPIRATORY_TRACT | Status: DC | PRN
Start: 2022-08-06 — End: 2022-08-06

## 2022-08-06 MED ORDER — SUGAMMADEX 100 MG/ML INTRAVENOUS SOLUTION
Freq: Once | INTRAVENOUS | Status: DC | PRN
Start: 2022-08-06 — End: 2022-08-06
  Administered 2022-08-06: 250 mg via INTRAVENOUS

## 2022-08-06 MED ORDER — DEXAMETHASONE SODIUM PHOSPHATE (PF) 10 MG/ML INJECTION SOLUTION
INTRAMUSCULAR | Status: AC
Start: 2022-08-06 — End: 2022-08-06
  Filled 2022-08-06: qty 1

## 2022-08-06 MED ORDER — LIDOCAINE (PF) 100 MG/5 ML (2 %) INTRAVENOUS SYRINGE
INJECTION | Freq: Once | INTRAVENOUS | Status: DC | PRN
Start: 2022-08-06 — End: 2022-08-06
  Administered 2022-08-06: 80 mg via INTRAVENOUS

## 2022-08-06 MED ORDER — CELECOXIB 100 MG CAPSULE
200.0000 mg | ORAL_CAPSULE | Freq: Once | ORAL | Status: DC
Start: 2022-08-06 — End: 2022-08-06

## 2022-08-06 MED ORDER — SODIUM CHLORIDE 0.9 % INTRAVENOUS PIGGYBACK
INJECTION | INTRAVENOUS | Status: AC
Start: 2022-08-06 — End: 2022-08-06
  Filled 2022-08-06: qty 100

## 2022-08-06 MED ORDER — ONDANSETRON HCL (PF) 4 MG/2 ML INJECTION SOLUTION
8.0000 mg | Freq: Once | INTRAMUSCULAR | Status: AC
Start: 2022-08-06 — End: 2022-08-06
  Administered 2022-08-06: 8 mg via INTRAVENOUS

## 2022-08-06 MED ORDER — PROPOFOL 10 MG/ML IV BOLUS
INJECTION | Freq: Once | INTRAVENOUS | Status: DC | PRN
Start: 2022-08-06 — End: 2022-08-06
  Administered 2022-08-06: 200 mg via INTRAVENOUS

## 2022-08-06 MED ORDER — HYDROCODONE 7.5 MG-ACETAMINOPHEN 325 MG TABLET
1.0000 | ORAL_TABLET | Freq: Four times a day (QID) | ORAL | 0 refills | Status: DC | PRN
Start: 2022-08-06 — End: 2022-11-25

## 2022-08-06 MED ORDER — EPHEDRINE SULFATE 50 MG/ML INTRAVENOUS SOLUTION
Freq: Once | INTRAVENOUS | Status: DC | PRN
Start: 2022-08-06 — End: 2022-08-06
  Administered 2022-08-06 (×3): 5 mg via INTRAVENOUS
  Administered 2022-08-06: 10 mg via INTRAVENOUS

## 2022-08-06 MED ORDER — NAPROXEN 250 MG TABLET
500.0000 mg | ORAL_TABLET | Freq: Once | ORAL | Status: AC
Start: 2022-08-06 — End: 2022-08-06
  Administered 2022-08-06: 500 mg via ORAL

## 2022-08-06 MED ORDER — FAMOTIDINE (PF) 20 MG/2 ML INTRAVENOUS SOLUTION
20.0000 mg | Freq: Once | INTRAVENOUS | Status: AC
Start: 2022-08-06 — End: 2022-08-06
  Administered 2022-08-06: 20 mg via INTRAVENOUS

## 2022-08-06 SURGICAL SUPPLY — 58 items
BANDAGE COFLX 5YDX4IN NONST CHSV SLF ADH FOAM COMPRESS TAN LF (WOUND CARE SUPPLY) ×1 IMPLANT
BLADE 11 2 END CBNSTL SURG STRL DISP (SURGICAL CUTTING SUPPLIES) ×2 IMPLANT
BLADE SHAVER 13CM 4MM EXCLBR C_OOLCUT STRL DISP (ENDOSCOPIC SUPPLIES) ×1 IMPLANT
BURR SHAVER 13CM 4MM COOLCUT 8 FLUTE OVAL STRL DISP (ENDOSCOPIC SUPPLIES) ×1 IMPLANT
CLEANER INSTR PREPZYME MUL-TRD CONTAINR NARSL NEUT PH BDGR 22OZ (MISCELLANEOUS PT CARE ITEMS) ×1
CONV USE 31829 - NEEDLE HYPO  18GA 1.5IN STD REG BVL LF (MED SURG SUPPLIES) ×1 IMPLANT
CONV USE ITEM 329146 - CLEANER INSTR PREPZYME MUL-TRD CONTAINR NARSL NEUT PH BDGR 22OZ (MISCELLANEOUS PT CARE ITEMS) ×1 IMPLANT
COUNTER 20 CNT BLOCK ADH NEEDLE STRL LF  RD SHARP FOAM 15.75X11.5X14IN DISP (MED SURG SUPPLIES) ×1 IMPLANT
COVER 53X24IN MAYOSTAND PRXM STRL DISP EQP SMS LF (DRAPE/PACKS/SHEETS/OR TOWEL) ×1 IMPLANT
COVER TBL 90X50IN STD SMS REINF FNFLD STRL LF  DISP (DRAPE/PACKS/SHEETS/OR TOWEL) ×2 IMPLANT
DISCONTINUED NO SUB - DRESS TRNSPR 4.5X4IN FILM IV STRL LF (WOUND CARE SUPPLY) ×1 IMPLANT
DRAPE 2 INCS FILM ANTIMIC 23X17IN IOBN STRL SURG (DRAPE/PACKS/SHEETS/OR TOWEL) ×2 IMPLANT
DRAPE FNFLD ABS REINF 77X53IN 43528 PRXM LF  STRL DISP SURG SMS 44X23IN (DRAPE/PACKS/SHEETS/OR TOWEL) ×5 IMPLANT
DRAPE INCS ANTIMIC 23X23IN IOBN2 TRNSPR (DRAPE/PACKS/SHEETS/OR TOWEL) ×1 IMPLANT
DRESS TRNSPR 4.5X4IN FILM IV STRL LF (WOUND CARE/ENTEROSTOMAL SUPPLY) ×1
DRUG DEL ONQ 2.5IN 26.5IN PUMP KIT ANTIMIC CATH DEHP 2ML/HR SYSTEM 100ML STRL DISP (MED SURG SUPPLIES) ×1 IMPLANT
GLOVE SURG 6 LF  PF BEAD CUF STRL CRM 11.3IN PROTEXIS PLISPRN THK9.1 MIL (GLOVES AND ACCESSORIES) IMPLANT
GLOVE SURG 6 LF  PF SMOOTH BEAD CUF INTLK STRL BLU 11.3IN PROTEXIS NEU-THERA PLISPRN THK7.9 MIL (GLOVES AND ACCESSORIES) IMPLANT
GLOVE SURG 6.5 LF  PF BEAD CUF STRL CRM 11.3IN PROTEXIS PI PLISPRN THK9.1 MIL (GLOVES AND ACCESSORIES) ×1 IMPLANT
GLOVE SURG 6.5 LF  PF SMOOTH BEAD CUF INTLK STRL BLU 11.3IN PROTEXIS NEU-THERA PLISPRN THK7.9 MIL (GLOVES AND ACCESSORIES) ×1 IMPLANT
GLOVE SURG 6.5 LTX PF SMOOTH BEAD CUF STRL YW 11.5IN PROTEXIS NEU-THERA DDRGL THK8.7 MIL (GLOVES AND ACCESSORIES) ×2 IMPLANT
GLOVE SURG 7 LF  PF BEAD CUF STRL CRM 11.8IN PROTEXIS PI PLISPRN THK9.1 MIL (GLOVES AND ACCESSORIES) IMPLANT
GLOVE SURG 7 LF  PF SMOOTH BEAD CUF INTLK STRL BLU 11.8IN PROTEXIS NEU-THERA PLISPRN THK7.9 MIL (GLOVES AND ACCESSORIES) ×2 IMPLANT
GLOVE SURG 7 LTX PF SMOOTH BEAD CUF STRL YW 12IN PROTEXIS NEU-THERA DDRGL THK8.7 MIL (GLOVES AND ACCESSORIES) IMPLANT
GLOVE SURG 7.5 LF  PF BEAD CUF STRL CRM 11.8IN PROTEXIS PI PLISPRN THK9.1 MIL (GLOVES AND ACCESSORIES) IMPLANT
GLOVE SURG 7.5 LF  PF SMOOTH BEAD CUF INTLK STRL BLU 11.8IN PROTEXIS NEU-THERA PLISPRN THK7.9 MIL (GLOVES AND ACCESSORIES) IMPLANT
GLOVE SURG 7.5 LTX PF SMOOTH BEAD CUF STRL YW 12IN PROTEXIS (GLOVES AND ACCESSORIES) IMPLANT
GLOVE SURG 8 LF  PF BEAD CUF STRL CRM 11.8IN PROTEXIS PI PLISPRN THK9.1 MIL (GLOVES AND ACCESSORIES) IMPLANT
GLOVE SURG 8 LF  PF SMOOTH BEAD CUF INTLK STRL BLU 11.8IN PROTEXIS NEU-THERA PLISPRN THK7.9 MIL (GLOVES AND ACCESSORIES) IMPLANT
GLOVE SURG 8 LTX PF SMOOTH BEAD CUF STRL YW 12IN PROTEXIS NEU-THERA DDRGL THK8.7 MIL (GLOVES AND ACCESSORIES) ×1 IMPLANT
GLOVE SURG 8.5 LF  PF BEAD CUF STRL CRM 11.8IN PROTEXIS PI PLISPRN THK9.1 MIL (GLOVES AND ACCESSORIES) IMPLANT
GOWN SURG LRG STD LGTH REG L3 NONREINFORCE BRTHBL TWL STRL LF  DISP BLU HALYARD SPECTRUM SMS (DRAPE/PACKS/SHEETS/OR TOWEL) ×1 IMPLANT
GOWN SURG XL STD LGTH L3 NONREINFORCE HKLP CLSR TWL STRL LF  DISP BLU SPECTRUM SMS (DRAPE/PACKS/SHEETS/OR TOWEL) ×2
GOWN SURG XL STD LGTH L3 NONREINFORCE HKLP CLSR TWL STRL LF DISP BLU SPECTRUM SMS (DRAPE/PACKS/SHEETS/OR TOWEL) ×2 IMPLANT
GOWN SURG XL XLNG L4 REINF HKLP CLSR SET IN SLEEVE STRL LF  DISP BLU SIRUS SMS PE 56IN (DRAPE/PACKS/SHEETS/OR TOWEL) ×1 IMPLANT
LABEL MED CORRECT MED LABELING SYS 4 FLG 2 SHEET 24 PRPRNT STRL (MED SURG SUPPLIES) ×1 IMPLANT
MAT INSTR TRY 44X36IN WTPRF BACKSHEET TPNX BLU (MISCELLANEOUS PT CARE ITEMS) ×3 IMPLANT
NEEDLE HYPO  18GA 1.5IN STD REG BVL LF (MED SURG SUPPLIES) ×1
NEEDLE SPINAL PNK 3.5IN 18GA QUINCKE REG WL POLYPROP QUINCKE TIP STRL LF  DISP (MED SURG SUPPLIES) IMPLANT
NEEDLE SPINAL YW 3.5IN 20GA QUINCKE REG WL POLYPROP STRL LF  DISP (MED SURG SUPPLIES) ×1 IMPLANT
NEEDLE SUT MEGALOADER HD SCORPION (SUTURE/WOUND CLOSURE) IMPLANT
PACK SURG ECLIPSE SHLDR PCH STRL DISP 114X77IN 100X66IN LF (DRAPE/PACKS/SHEETS/OR TOWEL) ×1 IMPLANT
PAD ABDOMINAL 8X7.5IN LF  STRL (WOUND CARE SUPPLY) ×2 IMPLANT
PROBE ESURG APOLLORF I90 90 DEG ASPIRATE ABLATOR (MED SURG SUPPLIES) ×1 IMPLANT
PUMP TUBING 13FT CONT WV III DUALWAVE ARTHRO STRL DISP (MED SURG SUPPLIES) ×1 IMPLANT
SET TUBING DUALWAVE CASSETTE OFLW ASCP PUMP STRL DISP (ENDOSCOPIC SUPPLIES) ×1 IMPLANT
SLING ORTHO LRG 17.5X8.5IN ARM SHLDR PAD ENV BRTHBL HKLP (ORTHOPEDICS (NOT IMPLANTS)) ×1 IMPLANT
SOL IRRG LR 3L PRSV N-PYRG FLXB CONTAINR STRL LF (MEDICATIONS/SOLUTIONS) ×4 IMPLANT
SOL SURG PREP 26ML DRPRP 74% ISPRP 0.7% IOD POVACRYLEX SLF CNTN APPL SKIN STRL PREOP (MED SURG SUPPLIES) ×1 IMPLANT
SPONGE GAUZE 4X4IN MDCHC COTTON 12 PLY TY 7 LF  STRL DISP (WOUND CARE SUPPLY) ×2 IMPLANT
SPONGE LAP 18X18IN PREWASH RIGID TRY STRL LF  WHT (MED SURG SUPPLIES) ×1 IMPLANT
SUTURE 2 T-5 FIBERWIRE 38IN BLU BRD TIE MS LWR KNT PROF NONAB (SUTURE/WOUND CLOSURE) IMPLANT
SUTURE 4-0 FS1 PROLENE 18IN BLU MONOF NONAB (SUTURE/WOUND CLOSURE) ×2 IMPLANT
SYRINGE LL 10ML LF  STRL GRAD N-PYRG DEHP-FR PVC FREE MED DISP (MED SURG SUPPLIES) ×1 IMPLANT
TOWEL 24X16IN COTTON BLU DISP SURG STRL LF (DRAPE/PACKS/SHEETS/OR TOWEL) ×4 IMPLANT
TUBING IRRG 6FT DUALWAVE BKFL CK VALVE EXT STRL DISP (ENDOSCOPIC SUPPLIES) ×1 IMPLANT
TUBING SUCT CLR 12FT .25IN ARGYLE PVC NCDTV STR MALE FEMALE MLD CONN STRL LF (MED SURG SUPPLIES) ×3 IMPLANT
TUBING SUCT CLR 6FT .25IN ARGYLE PVC NCDTV STR MALE FEMALE MLD CONN STRL LF (MED SURG SUPPLIES) ×1 IMPLANT

## 2022-08-06 NOTE — Anesthesia Transfer of Care (Signed)
ANESTHESIA TRANSFER OF CARE   George Vincent. is a 57 y.o. ,male, Weight: 115 kg (253 lb)   had Procedure(s):  ARTHROSCOPIC SUBACROMIAL DECOMPRESSION AND ON Q PAIN PUMP PLACEMENT LEFT SHOULDER  performed  08/06/22   Primary Service: Karsten Ro, MD    Past Medical History:   Diagnosis Date   . History of kidney stones    . Hypertension    . Inguinal hernia       Allergy History as of 08/06/22        No Known Allergies                  I completed my transfer of care / handoff to the receiving personnel during which we discussed:  Access, Airway, All key/critical aspects of case discussed, Analgesia, Antibiotics, Expectation of post procedure, Fluids/Product, Gave opportunity for questions and acknowledgement of understanding, Labs and PMHx      Post Location: PACU                                                           Last OR Temp: Temperature: 36.3 C (97.3 F)  ABG:  POTASSIUM   Date Value Ref Range Status   07/23/2022 3.7 3.5 - 5.1 mmol/L Final     KETONES   Date Value Ref Range Status   07/23/2022 Negative Negative, Trace mg/dL Final     CALCIUM   Date Value Ref Range Status   07/23/2022 8.9 8.6 - 10.3 mg/dL Final     Calculated P Axis   Date Value Ref Range Status   07/23/2022 -14 degrees Final     Calculated R Axis   Date Value Ref Range Status   07/23/2022 46 degrees Final     Calculated T Axis   Date Value Ref Range Status   07/23/2022 42 degrees Final     Airway:* No LDAs found *  Blood pressure (!) 143/93, pulse 92, temperature 36.3 C (97.3 F), resp. rate 16, height 1.829 m (6'), weight 115 kg (253 lb), SpO2 100%.

## 2022-08-06 NOTE — Anesthesia Procedure Notes (Signed)
George Vincent.    Airway Note  General Information and Staff   Authorizing provider: Orvilla Cornwall, DO  Performing provider: Almyra Free, CRNA        Urgency: elective    Airway not difficult    Indications and Patient Condition  Pt location: In Or  Indications for airway management: anesthesia  Spontaneous ventilation: present  Expected sedation level: under GA.  Preoxygenated: yes  Patient position: sniffing  Mask difficulty assessment: 1 - vent by mask        Final Airway Details  Final airway type: endotracheal airway        Successful airway: ETT and cuffed     Successful intubation technique: video laryngoscopy  Facilitating devices/methods: intubating stylet  Video scope brand: GlideScope     B[place]ard Type: Adult     ILMA Procedure :Hyperangulated blade  Endotracheal tube insertion site: oral  Blade size: #3  Airway size (mm): 8.0  Cormack-Lehane Classification: grade I - full view of glottis  Placement verified by: chest auscultation and capnometry   Marked at 22  Measured from: lips  Secured with: Tape  Number of attempts at approach: 1  Number of other approaches attempted: 0Airway complications: Atraumatic

## 2022-08-06 NOTE — Anesthesia Preprocedure Evaluation (Addendum)
ANESTHESIA PRE-OP EVALUATION  Planned Procedure: ARTHROSCOPIC SUBACROMIAL DECOMPRESSION WITH ROTATOR CUFF REPAIR LEFT SHOULDER (Left: Shoulder)  Review of Systems     anesthesia history negative               Pulmonary  negative pulmonary ROS,    Cardiovascular    Hypertension and ECG reviewed , Exercise Tolerance: > or = 4 METS        GI/Hepatic/Renal   negative GI/hepatic/renal ROS,         Endo/Other    obesity,      Neuro/Psych/MS   negative neuro/psych ROS,      Cancer                        Physical Assessment      Airway       Mallampati: III    TM distance: <3 FB                Dental       Dentition intact             Pulmonary    Breath sounds clear to auscultation       Cardiovascular    Rhythm: regular  Rate: Normal       Other findings              Plan  ASA 2     Planned anesthesia type: general                             Anesthesia issues/risks discussed are: Stroke, Aspiration, PONV, Nerve Injuries, Dental Injuries, Cardiac Events/MI and Sore Throat.  Anesthetic plan and risks discussed with patient  signed consent obtained          Patient's NPO status is appropriate for Anesthesia.           (glidescope)

## 2022-08-06 NOTE — OR Surgeon (Signed)
[facility]  Operative Note     PATIENT NAME:  George Vincent, George Vincent.  MRN:  [id number]  DOB:  29-Jun-1965    Date of Procedure:  08/06/2022  Preoperative Diagnosis: ROTATOR CUFF TEAR LEFT SHOULDER   Postoperative Diagnoses:  LEFT SHOULDER PAIN AND IMPINGEMENT   Procedure Performed: Procedure(s) (LRB):  ARTHROSCOPIC SUBACROMIAL DECOMPRESSION AND ON Q PAIN PUMP PLACEMENT LEFT SHOULDER (Left)   Findings:  Site FINDING Notes   Rotator cuff Minor scuffing subacromial, nl in           joint    Subacromial space Bursitis, impingement Debride / SAD   Biceps NL    Labrum Nl    Humeral head Nl    Glenoid Nl    Ligaments / capsule     Loose Bodies     Infection       Implants:* No implants in log *   Surgeon: Karsten Ro, MD   Anesthesia: Anesthesiologist: Orvilla Cornwall, DO  CRNA: Nadeen Landau, CRNA; Raelyn Ensign, CRNA; Almyra Free, CRNA general  OR Staff: Circulator: Lollie Marrow, RN  PERIOPERATIVE CARE ASSISTANT: Pernell Dupre, [place]; Helane Rima, PCA  Scrub Person: Jacquenette Shone, ST; Shrader, Warner N', ST  Scrub First Assist: Darnelle Maffucci, CST   Anticoagulation: ASA     Antibiotics:  Kefzol  Estimated Blood Loss: * No blood loss amount entered *   Specimens: * No specimens in log *   Complications: None immediate  Indications For Procedure:  [name].  is a pleasant 57 y.o. male  presenting  for [place] Risks, benefits, indications, risks, complications, and expectations for shoulder arthroscopy was discussed and contrasted with open and non-surgical care. Informed signed consent obtained.  Description of procedure:  Diagnostic arthroscopy:  Site and site was identified.  Time-out was completed with the entire operating room staff.  After adequate anesthesia, the patient was positioned in lateral decubitus position involved side up with 7 pounds of traction.  Diagnostic arthroscopy was performed with the above listed  findings.  Subacromial Decompression:  After debridement of the bursal tissue in the subacromial space, distal acromion was carefully visualized.  The bur was positioned posteriorly and then the anterior portion of the acromion was removed with the bur until the anterolateral hook was removed, and the subacromial surface was flat.  Stasis was as needed with cautery.  Cuff was carefully inspected and palpated from subacromial and GH side.  No significant tear iidentified.  Cuff photographed.  Pain pump insertion:   The nonabsorbable pain location administration device was implemented next.  The break apart catheter was positioned in the subacromial space with percutaneous puncture.  Catheter was inserted under direct vision.  Once position was verified, the catheter was connected to the prefilled drug administration device.  Incisions were closed and patient was taken recovery under the supervision anesthesia.

## 2022-08-06 NOTE — Anesthesia Postprocedure Evaluation (Signed)
Anesthesia Post Op Evaluation    Patient: George Vincent.  Procedure(s):  ARTHROSCOPIC SUBACROMIAL DECOMPRESSION AND ON Q PAIN PUMP PLACEMENT LEFT SHOULDER    Last Vitals:Temperature: 36.3 C (97.3 F) (08/06/22 1432)  Heart Rate: 92 (08/06/22 1432)  BP (Non-Invasive): (!) 143/93 (08/06/22 1432)  Respiratory Rate: 16 (08/06/22 1432)  SpO2: 100 % (08/06/22 1432)    No notable events documented.    Patient is sufficiently recovered from the effects of anesthesia to participate in the evaluation and has returned to their pre-procedure level.  Patient location during evaluation: PACU       Patient participation: complete - patient participated  Level of consciousness: awake and alert and responsive to verbal stimuli    Pain management: adequate  Airway patency: patent    Anesthetic complications: no  Cardiovascular status: acceptable  Respiratory status: acceptable  Hydration status: acceptable  Patient post-procedure temperature: Pt Normothermic   PONV Status: Absent

## 2022-08-06 NOTE — Nursing Note (Signed)
Pt has On Q pump intact to left shoulder.

## 2022-08-06 NOTE — PT Evaluation (Signed)
Pt instructed in Pendulum ex's and given written ex sheet. Pt asked questions regarding his sling wear. This PT consulted with nurse who answered pt's questions.

## 2022-08-06 NOTE — OR Nursing (Signed)
PICTURES TAKEN? YES

## 2022-08-06 NOTE — Discharge Instructions (Addendum)
Follow up in office on 08/20/22 at 1:55 pm.    Call the office with any questions or concerns that you may have.     Follow any instructions given to you by Dr. Marcelino Scot.     Leave your dressing in place for 2-3 days, until time to remove your On Q pump.     Follow instructions on hand out provided and nursing instructions for removing the On Q pump at home.     Wear your sling at all times, except when showering or doing pendulum exercises.    You may shower starting tomorrow. Do Not lay in any body of water until approved by your doctor.     Do Not lift, pull, push, or tug anything with your operative arm until approved by your doctor.     Resume home medications as instructed.     Stop by your pharmacy and pick up your prescription.     Script:  Norco 7.'5mg'$   take one every 6 hours as needed for pain.

## 2022-11-06 ENCOUNTER — Encounter (RURAL_HEALTH_CENTER): Payer: Self-pay | Admitting: Family Medicine

## 2022-11-11 ENCOUNTER — Other Ambulatory Visit (RURAL_HEALTH_CENTER): Payer: Self-pay | Admitting: Family Medicine

## 2022-11-11 DIAGNOSIS — I10 Essential (primary) hypertension: Secondary | ICD-10-CM

## 2022-11-11 DIAGNOSIS — R972 Elevated prostate specific antigen [PSA]: Secondary | ICD-10-CM

## 2022-11-11 DIAGNOSIS — M546 Pain in thoracic spine: Secondary | ICD-10-CM

## 2022-11-11 DIAGNOSIS — E669 Obesity, unspecified: Secondary | ICD-10-CM

## 2022-11-11 DIAGNOSIS — M12819 Other specific arthropathies, not elsewhere classified, unspecified shoulder: Secondary | ICD-10-CM

## 2022-11-19 LAB — COMPREHENSIVE METABOLIC PANEL, NON-FASTING
ALBUMIN: 4.3 (ref 3.8–4.9)
ALKALINE PHOSPHATASE: 87 (ref 44–121)
ALT (SGPT): 46 (ref 0–44)
AST (SGOT): 39 (ref 0–40)
BILIRUBIN, TOTAL: 0.6 (ref 0.0–1.2)
BUN/CREAT RATIO: 15 (ref 9–20)
BUN: 16 (ref 6–24)
CALCIUM: 9.2 (ref 8.7–10.0)
CARBON DIOXIDE: 27 (ref 20–29)
CHLORIDE: 102 (ref 96–106)
CREATININE: 1.09 (ref 0.76–1.27)
ESTIMATED GLOMERULAR FILTRATION RATE: 79 (ref >59)
GLUCOSE,NONFAST: 88 (ref 70–99)
POTASSIUM: 4.2 (ref 3.5–5.2)
SODIUM: 142 (ref 134–144)
TOTAL PROTEIN: 6.6 (ref 6.0–8.5)

## 2022-11-19 LAB — CBC/DIFF
BASOPHILS: 1
BASOS ABS: 0 (ref 0.0–0.2)
EOS ABS: 0.3 (ref 0.0–0.4)
EOSINOPHIL: 3
HCT: 44.6 (ref 37.5–51.0)
HGB: 15.1 (ref 13.0–17.7)
LYMPHOCYTES: 34
LYMPHS ABS: 2.9 (ref 0.7–3.1)
MCH: 31.8 (ref 26.6–33.0)
MCHC: 33.9 (ref 31.5–35.7)
MCV: 94 (ref 79–97)
MONOCYTES: 8
MONOS ABS: 0.7 (ref 0.1–0.9)
PLATELET COUNT: 243 (ref 150–450)
RBC: 4.75 (ref 4.14–5.80)
RDW: 13.5 (ref 11.6–15.4)
WBC: 8.4 (ref 3.4–10.8)

## 2022-11-19 LAB — HGA1C (HEMOGLOBIN A1C WITH EST AVG GLUCOSE): HEMOGLOBIN A1C: 6 (ref 4.8–5.6)

## 2022-11-19 LAB — THYROID STIMULATING HORMONE (SENSITIVE TSH): TSH: 3.86 (ref 0.450–4.500)

## 2022-11-19 LAB — LIPID PANEL
CHOLESTEROL: 191 (ref 100–199)
HDL-CHOLESTEROL: 40 (ref >39)
LDL (CALCULATED): 129 (ref 0–99)
TRIGLYCERIDES: 120 (ref 0–149)
VLDL (CALCULATED): 22 (ref 5–40)

## 2022-11-19 LAB — PSA SCREENING: PROSTATE SPECIFIC AG: 2.2 (ref 0.0–4.0)

## 2022-11-20 ENCOUNTER — Other Ambulatory Visit (RURAL_HEALTH_CENTER): Payer: Self-pay

## 2022-11-20 DIAGNOSIS — E669 Obesity, unspecified: Secondary | ICD-10-CM

## 2022-11-20 DIAGNOSIS — R972 Elevated prostate specific antigen [PSA]: Secondary | ICD-10-CM

## 2022-11-20 DIAGNOSIS — I10 Essential (primary) hypertension: Secondary | ICD-10-CM

## 2022-11-25 ENCOUNTER — Ambulatory Visit (RURAL_HEALTH_CENTER): Payer: BC Managed Care – PPO | Attending: Family Medicine | Admitting: Family Medicine

## 2022-11-25 ENCOUNTER — Other Ambulatory Visit: Payer: Self-pay

## 2022-11-25 ENCOUNTER — Encounter (RURAL_HEALTH_CENTER): Payer: Self-pay | Admitting: Family Medicine

## 2022-11-25 VITALS — BP 121/79 | HR 66 | Temp 98.5°F | Resp 20 | Wt 261.2 lb

## 2022-11-25 DIAGNOSIS — Z79899 Other long term (current) drug therapy: Secondary | ICD-10-CM | POA: Insufficient documentation

## 2022-11-25 DIAGNOSIS — R7303 Prediabetes: Secondary | ICD-10-CM | POA: Insufficient documentation

## 2022-11-25 DIAGNOSIS — E782 Mixed hyperlipidemia: Secondary | ICD-10-CM | POA: Insufficient documentation

## 2022-11-25 DIAGNOSIS — H00019 Hordeolum externum unspecified eye, unspecified eyelid: Secondary | ICD-10-CM | POA: Insufficient documentation

## 2022-11-25 DIAGNOSIS — I10 Essential (primary) hypertension: Secondary | ICD-10-CM | POA: Insufficient documentation

## 2022-11-25 DIAGNOSIS — M79672 Pain in left foot: Secondary | ICD-10-CM | POA: Insufficient documentation

## 2022-11-25 DIAGNOSIS — M79671 Pain in right foot: Secondary | ICD-10-CM | POA: Insufficient documentation

## 2022-11-25 DIAGNOSIS — H00012 Hordeolum externum right lower eyelid: Secondary | ICD-10-CM | POA: Insufficient documentation

## 2022-11-25 MED ORDER — ERYTHROMYCIN 5 MG/GRAM (0.5 %) EYE OINTMENT
TOPICAL_OINTMENT | Freq: Three times a day (TID) | OPHTHALMIC | 1 refills | Status: DC
Start: 2022-11-25 — End: 2023-05-26

## 2022-11-25 MED ORDER — HYDROCHLOROTHIAZIDE 12.5 MG TABLET
12.5000 mg | ORAL_TABLET | Freq: Every day | ORAL | 1 refills | Status: DC
Start: 2022-11-25 — End: 2023-05-13

## 2022-11-25 MED ORDER — HYDROCHLOROTHIAZIDE 12.5 MG TABLET
12.5000 mg | ORAL_TABLET | Freq: Every day | ORAL | 1 refills | Status: DC
Start: 2022-11-25 — End: 2022-11-25

## 2022-11-25 NOTE — Progress Notes (Unsigned)
FAMILY MEDICINE, [facility]  [address]  [place] [address]  Operated by [facility]     Name: George Vincent. MRN:  [id number]   Date: 11/25/2022 Age: 57 y.o.          Provider: Arnell Sieving, DO    Reason for visit: Follow Up 6 Months (Seen cardiologist since last visit - started on amlodipine but patient states he is not taking it. Started taking HCTZ 12.5mg  daily. (Old prescription) States he took his last one today and needs rx. //Bilateral foot pain. Started 2 months ago. Hurts like a bruise when first gets up in the morning then by 10am they are cramping to the ankles. //June 25th had a heart rate of 92-103 for . No symptoms. //Stye right eye since Friday. Has been using hot compresses. Questions need for antibiotic)      History of Present Illness:  George Vincent. is a 57 y.o. male presents today for a 6 month follow up appointment.     Essential Hypertension:  Saw cardiology in April and amlodipine 5mg  was added. He reports he is not taking this currently. He restarted the HCTZ he was previously on but was taking an old rx.   He states he did try taking the amlodipine x 15 days but did not feel well while on it. He has been monitoring his BP twice daily and brings in logs showing the readings which are primarily low 100's/70-80's. He did have one episode where his HR ran 92-103 for approximately 30 minutes but he denies any symptoms associated with that.   He denies chest pain, shortness of breath, headache, dizziness or palpitations.     Bilateral foot pain:   He states his feet feel like they are bruised and by the end of the day he feels a stretching sensation by the end of the day. He did purchase a treadmill but hasn't been to use it because of his feet. Regardless of shoes - pain persists. He gets new shoes every 3 months and has changed insoles. He states after he took ibuprofen 800mg  three times/day and today it felt better.  Feels like the pad in the toes of his feet are trying to go numb. He said it gets worse after sitting but does improve after prolonged sitting/resting.     Sty:  Right Eye - noticed it Friday. Used warm compresses on Saturday and last night. He states it is really tender today compared to previous       Worker's compesnsation Injuries:   Rotator cuff surgery:   Completed in March and reports that his shoulder is doing better.      Thoracic Sprain:   11/25/2022:He states his most recent case worker quit and he has spent the last two months trying to get documentation that the chronic changes were not related to the problem.   06/04/2022: Dr. Jennye Moccasin in [place] - referred to pain management  If he is upright and not leaning against his back - no problems. If he rotates to the left he feels soreness on the right side. He states he if lays down on the floor he cannot get up on his own.   After sitting in his recliner at the end of the day when he sits in the recliner he is  Bed - goes to sleep ok but if he wakes up then he cannot get the soreness to go away.    Had MRI  that was essentially "unremarkable"  05/01/2022: MRI Thoracic Spine:   No acute fracture or bone marrow edema of thoracic spine.   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  No evidence of thoracic spinal stenosis or foraminal stenosis is seen. Thoracic spinal cord shows no focal abnormalities.   Above-mentioned findings are chronic in nature.         Increase in PSA  He denies any current symptoms.  Previously 0.7 then increased 2.49  He reports family history of cancer.      Labs from 05/08/2022:   Glucose 68  A1C 5.7  Bun 15/Cr 1.08  T. Bilirubin 0.53  Urine is negative  TC 186  HDL 42.2  LDL 129  TG 74    Historical Data    Past Medical History:  Past Medical History:   Diagnosis Date    History of kidney stones     Hypertension     Inguinal hernia           Allergies:  No Known Allergies  Medications:  Current Outpatient Medications   Medication Sig    erythromycin (ROMYCIN) 5 mg/gram (0.5 %) Ophthalmic Ointment Apply to right eye Three times a day    hydroCHLOROthiazide (HYDRODIURIL) 12.5 mg Oral Tablet Take 1 Tablet (12.5 mg total) by mouth Once a day    valsartan (DIOVAN) 80 mg Oral Tablet Takes one tablet morning and 1/2 in evening (Patient taking differently: Take 0.5 Tablets (40 mg total) by mouth Twice daily Taking 1/2 twice a day)     Social History:  Social History     Socioeconomic History    Marital status: Single   Tobacco Use    Smoking status: Never    Smokeless tobacco: Never   Vaping Use    Vaping status: Never Used   Substance and Sexual Activity    Alcohol use: Never    Drug use: Never           Review of Systems:  Other than ROS in the HPI, all other systems were negative.      Physical Exam:  Vital Signs:  Vitals:    11/25/22 1646   BP: 121/79   Pulse: 66   Resp: 20   Temp: 36.9 C (98.5 F)   SpO2: 98%   Weight: 119 kg (261 lb 4 oz)     Physical Exam  Vitals reviewed.   Constitutional:       General: He is not in acute distress.     Appearance: Normal appearance.   HENT:      Mouth/Throat:      Mouth: Mucous membranes are moist.   Eyes:      General:         Right eye: Hordeolum present. No discharge.         Left eye: No discharge.     Neck:      Vascular: No carotid bruit.   Cardiovascular:      Rate and Rhythm: Normal rate and regular rhythm.      Pulses: Normal pulses.      Heart sounds: Normal heart sounds. No murmur heard.  Pulmonary:      Effort: Pulmonary effort is normal.      Breath sounds: Normal breath sounds.   Abdominal:      General: Bowel sounds are normal.      Palpations: Abdomen is soft.  Tenderness: There is no abdominal tenderness. There is no guarding.   Musculoskeletal:         General: No swelling or deformity.      Cervical back: Neck supple. No tenderness.   Lymphadenopathy:      Cervical: No cervical  adenopathy.   Skin:     General: Skin is warm and dry.   Neurological:      General: No focal deficit present.      Mental Status: He is alert.   Psychiatric:         Mood and Affect: Mood normal.         Thought Content: Thought content normal.            Previously completed labs, imaging studies and medical records were reviewed as part of this office visit.     Assessment/Plan:    ICD-10-CM    1. Sty  H00.019 Encouraged him to continue with warm compresses. Empiric antibiotics prescribed since it is more tender and red today. Discussed the erythromycin ointment may cause slight blurred vision as the ointment spreads but should not cause any other problems/side effects. Follow up if symptoms fail to improve or change/worsen.     erythromycin (ROMYCIN) 5 mg/gram (0.5 %) Ophthalmic Ointment      2. Essential hypertension  I10 Continue with current HCTZ and Losartan. Continue to monitor at home and follow up if persistently elevated. Stressed importance of additional dietary potassium supplement due to HCTZ.     hydroCHLOROthiazide (HYDRODIURIL) 12.5 mg Oral Tablet     CBC/DIFF     THYROID STIMULATING HORMONE (SENSITIVE TSH)     DISCONTINUED: hydroCHLOROthiazide (HYDRODIURIL) 12.5 mg Oral Tablet      3. Mixed hyperlipidemia  E78.2 Patient declines medication and plans on working to lose weight and exercise. He has purchased a treadmill and plans on starting walking once he gets his feet figured out.     CBC/DIFF     COMPREHENSIVE METABOLIC PANEL, NON-FASTING     LIPID PANEL     THYROID STIMULATING HORMONE (SENSITIVE TSH)      4. Prediabetes  R73.03 Based on labs. Patient currently asymptomatic. Continue to monitor.     CBC/DIFF     HGA1C (HEMOGLOBIN A1C WITH EST AVG GLUCOSE)     THYROID STIMULATING HORMONE (SENSITIVE TSH)      5. Foot pain, bilateral  M79.671 Recommended we start with xrays for evaluation for bony abnormalities or heel spurs. May take ibuprofen PRN - just monitor BP as that may cause an increase in  your BP if you take ibuprofen regularly. Continue to change out shoes regularly and change insoles. Continue to wear good supportive shoes. If symptoms fail to improve, will send to podiatry for evaluation and treatment.     XR FEET WT BEARING BILATERAL    M79.672           Depression screening is negative. PHQ 2 Total: 0         Return in about 6 months (around 05/28/2023) for In Person Visit - CDM.    Arnell Sieving, DO     Portions of this note may be dictated using voice recognition software or a dictation service. Variances in spelling and vocabulary are possible and unintentional. Not all errors are caught/corrected. Please notify the [facility] Parkin if any discrepancies are noted or if the meaning of any statement is not clear.

## 2022-11-25 NOTE — Nursing Note (Signed)
Medication list reviewed with patient. Unable to validate accuracy of med list due to medication bottles were not available at visit.   Encouraged patient to bring bottles of all prescribed medications to visits.

## 2022-12-10 ENCOUNTER — Ambulatory Visit (RURAL_HEALTH_CENTER): Payer: Self-pay | Admitting: Family Medicine

## 2023-03-03 ENCOUNTER — Encounter (INDEPENDENT_AMBULATORY_CARE_PROVIDER_SITE_OTHER): Payer: Self-pay | Admitting: Surgery

## 2023-05-12 ENCOUNTER — Other Ambulatory Visit (RURAL_HEALTH_CENTER): Payer: Self-pay | Admitting: Family Medicine

## 2023-05-12 DIAGNOSIS — I10 Essential (primary) hypertension: Secondary | ICD-10-CM

## 2023-05-26 ENCOUNTER — Encounter (RURAL_HEALTH_CENTER): Payer: Self-pay | Admitting: Family Medicine

## 2023-05-26 ENCOUNTER — Other Ambulatory Visit: Payer: Self-pay

## 2023-05-26 ENCOUNTER — Ambulatory Visit (RURAL_HEALTH_CENTER): Payer: BC Managed Care – PPO | Attending: Family Medicine | Admitting: Family Medicine

## 2023-05-26 VITALS — BP 127/84 | HR 72 | Temp 98.5°F | Resp 16 | Wt 259.5 lb

## 2023-05-26 DIAGNOSIS — Z79899 Other long term (current) drug therapy: Secondary | ICD-10-CM | POA: Insufficient documentation

## 2023-05-26 DIAGNOSIS — M546 Pain in thoracic spine: Secondary | ICD-10-CM | POA: Insufficient documentation

## 2023-05-26 DIAGNOSIS — M791 Myalgia, unspecified site: Secondary | ICD-10-CM | POA: Insufficient documentation

## 2023-05-26 DIAGNOSIS — M79671 Pain in right foot: Secondary | ICD-10-CM | POA: Insufficient documentation

## 2023-05-26 DIAGNOSIS — M79672 Pain in left foot: Secondary | ICD-10-CM | POA: Insufficient documentation

## 2023-05-26 DIAGNOSIS — E782 Mixed hyperlipidemia: Secondary | ICD-10-CM | POA: Insufficient documentation

## 2023-05-26 DIAGNOSIS — I10 Essential (primary) hypertension: Secondary | ICD-10-CM | POA: Insufficient documentation

## 2023-05-26 DIAGNOSIS — R7303 Prediabetes: Secondary | ICD-10-CM | POA: Insufficient documentation

## 2023-05-26 DIAGNOSIS — M419 Scoliosis, unspecified: Secondary | ICD-10-CM | POA: Insufficient documentation

## 2023-05-26 NOTE — Progress Notes (Signed)
FAMILY MEDICINE, [facility]  [address]  [place] [address]  Operated by [facility]     Name: George Vincent. MRN:  [id number]   Date: 05/26/2023 Age: 57 y.o.          Provider: Arnell Sieving, DO    Reason for visit: Follow Up 6 Months (No changes, back and foot pain 8/10)      History of Present Illness:  George Slife. is a 57 y.o. male presents today for a 6 month follow up appointment.     Essential Hypertension:  He is taking the Valsartan 40 mg po BID and HCTZ in the am. He brings in logs of his readings since July 2024. These show excellent readings and overall monthly average is <120/80. No hypotensive episodes recorded. He does occasionally forget to take his evening dose of valsartan but reports that it is not a regularly occurrence.   He states he did try taking the amlodipine x 15 days but did not feel well while on it. He has been monitoring his BP twice daily and brings in logs showing the readings which are primarily low 100's/70-80's. He did have one episode where his HR ran 92-103 for approximately 30 minutes but he denies any symptoms associated with that.   He denies chest pain, shortness of breath, headache, dizziness or palpitations.      Bilateral foot pain:   05/26/2023: States that his ankles feel stiff like he has rolled them by the end of every day. In the morning he has a numb/tingling sensation in his second and third toes. Then pain/bruised sensation in the arch of bilateral feet - left foot is worse than right foot. He states that he rotates shoes throughout the week because he is on his feet a lot at work (walks sometimes up to 7 miles a day). Wears crocs when he is at home but has good tennis shoes that he wears.   11/25/2022: He states his feet feel like they are bruised and by the end of the day he feels a stretching sensation by the end of the day. He did purchase a treadmill but hasn't been to use it because of  his feet. Regardless of shoes - pain persists. He gets new shoes every 3 months and has changed insoles. He states after he took ibuprofen 800mg  three times/day and today it felt better. Feels like the pad in the toes of his feet are trying to go numb. He said it gets worse after sitting but does improve after prolonged sitting/resting.        Worker's compesnsation Injuries:   Rotator cuff surgery:   Completed in March and reports that his shoulder is doing better.      Thoracic Back Pain/Sprain:   05/26/2023: States that he cannot tolerate the thoracic spine pain anymore. He states as long as he doesn't use his arms to lift himself off the seat or any type of strain - causes severe spasm. If he is on his side working and he rolls onto his back he gets severe pain that causes him to see stars. He states that it makes him extremely irritable and angry. If he is working on his back, such as under a car, he has to roll over to get up but has to do it slowly. Worse at the end of the day. He denies significant low back pain - admits to "cricking low back". Saw his sister on  Tuesday who adjusted his low back. No improvement with routine ibuprofen administration or steroids. Has tried PT in the past without relief.   11/25/2022:He states his most recent case worker quit and he has spent the last two months trying to get documentation that the chronic changes were not related to the problem.   06/04/2022: Dr. Jennye Moccasin in [place] - referred to pain management  If he is upright and not leaning against his back - no problems. If he rotates to the left he feels soreness on the right side. He states he if lays down on the floor he cannot get up on his own.   After sitting in his recliner at the end of the day when he sits in the recliner he is  Bed - goes to sleep ok but if he wakes up then he cannot get the soreness to go away.    Had MRI that was essentially "unremarkable"  05/01/2022: MRI Thoracic Spine:   No acute fracture or  bone marrow edema of thoracic spine.   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  No evidence of thoracic spinal stenosis or foraminal stenosis is seen. Thoracic spinal cord shows no focal abnormalities.   Above-mentioned findings are chronic in nature.         Increase in PSA  He denies any current symptoms.  Previously 0.7 then increased 2.49  He reports family history of cancer.      Labs from 05/08/2022:   Glucose 68  A1C 5.7  Bun 15/Cr 1.08  T. Bilirubin 0.53  Urine is negative  TC 186  HDL 42.2  LDL 129  TG 74    Historical Data    Past Medical History:  Past Medical History:   Diagnosis Date    History of kidney stones     Hypertension     Inguinal hernia          Allergies:  No Known Allergies  Medications:  Current Outpatient Medications   Medication Sig    erythromycin (ROMYCIN) 5 mg/gram (0.5 %) Ophthalmic Ointment Apply to right eye Three times a day (Patient not taking: Reported on 05/26/2023)    hydroCHLOROthiazide (HYDRODIURIL) 12.5 mg Oral Tablet TAKE 1 TABLET ONCE DAILY    valsartan (DIOVAN) 80 mg Oral Tablet Takes one tablet morning and 1/2 in evening (Patient taking differently: Take 0.5 Tablets (40 mg total) by mouth Twice daily Taking 1/2 twice a day)     Social History:  Social History     Socioeconomic History    Marital status: Single   Tobacco Use    Smoking status: Never    Smokeless tobacco: Never   Vaping Use    Vaping status: Never Used   Substance and Sexual Activity    Alcohol use: Never    Drug use: Never           Review of Systems:  Other than ROS in the HPI, all other systems were negative.    Physical Exam:  Vital Signs:  Vitals:    05/26/23 1620   BP: 127/84   Pulse: 72   Resp: 16   Temp: 36.9 C (98.5 F)   TempSrc: Skin   SpO2: 97%   Weight: 118 kg (259 lb 8 oz)     Physical Exam  Vitals and nursing note reviewed.   Constitutional:       General: He is not in acute  distress.     Appearance:  Normal appearance.   HENT:      Mouth/Throat:      Mouth: Mucous membranes are moist.   Cardiovascular:      Rate and Rhythm: Normal rate.      Heart sounds: No murmur heard.  Pulmonary:      Breath sounds: Normal breath sounds.   Abdominal:      General: Bowel sounds are normal.      Palpations: Abdomen is soft.      Tenderness: There is no abdominal tenderness. There is no guarding.   Musculoskeletal:         General: No swelling or deformity.      Comments: Slight thoracic scoliotic curve noted with convexity to right. Increased muscle spasm on right appreciated in thoracic region.    Skin:     General: Skin is warm and dry.   Neurological:      General: No focal deficit present.      Mental Status: He is alert.         Previously completed labs, imaging studies and medical records were reviewed as part of this office visit.     Assessment/Plan:         ICD-10-CM    1. Thoracic back pain  M54.6 Recommended he follow up with pain management for evaluation and indications for injections in the thoracic region. Discussed that since his pain is primarily slightly laterally possibility of facet joint arthritis that may be causing - particularly with the increased pain associated with certain movements. He has been tried on steroids, ibuprofen, muscle relaxers, and PT without relief. He has seen neurology/surgery and was advised there was no surgical indication.     Referral to External Provider      2. Myalgia  M79.10 Discussed ordering of ANA and RF to rule out autoimmune causes of his back and feet/legs pain. Reviewed that these tests are sensitive to autoimmune disorders but not specific and therefore a positive result simply indicates the need for additional testing and possible work up.     HEP-2 SUBSTRATE ANTINUCLEAR ANTIBODIES (ANA), SERUM     RHEUMATOID FACTOR, SERUM      3. Foot pain, bilateral  M79.671 Will start with EMG/NCS to determine if pain is neuropathy mediated and if so if it is just peripheral or  if it is coming from a more central location. Also discussed possible podiatry referral depending on symptoms and progression.     HEP-2 SUBSTRATE ANTINUCLEAR ANTIBODIES (ANA), SERUM    M79.672 RHEUMATOID FACTOR, SERUM     Referral to External Provider      4. Essential hypertension  I10 Chronic. Stable. Continue current medications as previously prescribed.     THYROID STIMULATING HORMONE (SENSITIVE TSH)     CBC/DIFF      5. Mixed hyperlipidemia  E78.2 Chronic. Labs ordered and we will monitor. States he is unable to exercise right now due to his back and feet pain. Previously was trying to walk on the treadmill. We discussed diet and other possible forms of exercise such as recumbent bike that would take any additional pressure off of his feet and his back.     THYROID STIMULATING HORMONE (SENSITIVE TSH)     LIPID PANEL     COMPREHENSIVE METABOLIC PANEL, NON-FASTING     CBC/DIFF      6. Prediabetes  R73.03 THYROID STIMULATING HORMONE (SENSITIVE TSH)     HGA1C (HEMOGLOBIN A1C WITH EST AVG GLUCOSE)  CBC/DIFF        The patient was given opportunity to ask questions and these were answered to his satisfaction. Advised him to call if he has any additional questions or concerns.     Return in about 6 months (around 11/24/2023) for In Person Visit - CDM. We will follow up in the interim as we get results of testing and imaging results back to discuss next steps and work up/referrals.     Arnell Sieving, DO     Portions of this note may be dictated using voice recognition software or a dictation service. Variances in spelling and vocabulary are possible and unintentional. Not all errors are caught/corrected. Please notify the [facility] Parkin if any discrepancies are noted or if the meaning of any statement is not clear.

## 2023-05-27 LAB — COMPREHENSIVE METABOLIC PANEL, NON-FASTING
ALBUMIN: 4 (ref 3.8–4.9)
ALKALINE PHOSPHATASE: 85 (ref 44–121)
ALT (SGPT): 21 (ref 0–44)
AST (SGOT): 23 (ref 0–40)
BILIRUBIN, TOTAL: 0.6 (ref 0.0–1.2)
BUN/CREAT RATIO: 16 (ref 9–20)
BUN: 17 (ref 6–24)
CALCIUM: 9.2 (ref 8.7–10.2)
CARBON DIOXIDE: 25 (ref 20–29)
CHLORIDE: 101 (ref 96–106)
CREATININE: 1.06 (ref 0.76–1.27)
ESTIMATED GLOMERULAR FILTRATION RATE: 81 (ref >59)
GLUCOSE,NONFAST: 78 (ref 70–99)
POTASSIUM: 3.9 (ref 3.5–5.2)
SODIUM: 142 (ref 134–144)
TOTAL PROTEIN: 6.3 (ref 6.0–8.5)

## 2023-05-27 LAB — HEP-2 SUBSTRATE ANTINUCLEAR ANTIBODIES (ANA), SERUM: ANA DIRECT: NEGATIVE

## 2023-05-27 LAB — CBC/DIFF
BASOPHILS: 1
BASOS ABS: 0.1 (ref 0.0–0.2)
EOS ABS: 0.3 (ref 0.0–0.4)
EOSINOPHIL: 3
HCT: 44.1 (ref 37.5–51.0)
HGB: 14.9 (ref 13.0–17.7)
LYMPHOCYTES: 37
LYMPHS ABS: 3.6 (ref 0.7–3.1)
MCH: 33.1 (ref 26.6–33.0)
MCHC: 33.8 (ref 31.5–35.7)
MCV: 98 (ref 79–97)
MONOCYTES: 9
MONOS ABS: 0.9 (ref 0.1–0.9)
PLATELET COUNT: 244 (ref 150–450)
RBC: 4.5 (ref 4.14–5.80)
RDW: 13.9 (ref 11.6–15.4)
WBC: 9.8 (ref 3.4–10.8)

## 2023-05-27 LAB — LIPID PANEL
CHOLESTEROL: 183 (ref 100–199)
HDL-CHOLESTEROL: 40 (ref >39)
LDL (CALCULATED): 123 (ref 0–99)
TRIGLYCERIDES: 110 (ref 0–149)
VLDL (CALCULATED): 20 (ref 5–40)

## 2023-05-27 LAB — THYROID STIMULATING HORMONE (SENSITIVE TSH): TSH: 3.74 (ref 0.450–4.500)

## 2023-05-27 LAB — RHEUMATOID FACTOR, SERUM: RHEUMATOID FACTOR: <10 (ref <14.0)

## 2023-05-27 LAB — HGA1C (HEMOGLOBIN A1C WITH EST AVG GLUCOSE): HEMOGLOBIN A1C: 5.9 (ref 4.8–5.6)

## 2023-05-29 ENCOUNTER — Other Ambulatory Visit: Payer: Self-pay

## 2023-05-30 ENCOUNTER — Ambulatory Visit
Admission: RE | Admit: 2023-05-30 | Discharge: 2023-05-30 | Disposition: A | Discharge status: Home / Self Care | Payer: BC Managed Care – PPO | Source: Ambulatory Visit | Attending: Family Medicine | Admitting: Family Medicine

## 2023-05-30 ENCOUNTER — Other Ambulatory Visit (RURAL_HEALTH_CENTER): Payer: Self-pay

## 2023-05-30 ENCOUNTER — Other Ambulatory Visit: Payer: Self-pay

## 2023-05-30 DIAGNOSIS — E782 Mixed hyperlipidemia: Secondary | ICD-10-CM

## 2023-05-30 DIAGNOSIS — M79671 Pain in right foot: Secondary | ICD-10-CM

## 2023-05-30 DIAGNOSIS — R7303 Prediabetes: Secondary | ICD-10-CM

## 2023-05-30 DIAGNOSIS — M79672 Pain in left foot: Secondary | ICD-10-CM | POA: Insufficient documentation

## 2023-05-30 DIAGNOSIS — I10 Essential (primary) hypertension: Secondary | ICD-10-CM

## 2023-05-30 DIAGNOSIS — M791 Myalgia, unspecified site: Secondary | ICD-10-CM

## 2023-06-05 ENCOUNTER — Telehealth (RURAL_HEALTH_CENTER): Payer: Self-pay | Admitting: Family Medicine

## 2023-06-05 NOTE — Telephone Encounter (Signed)
George Vincent has an appt 06/17/2023 @ 11:30 with Dr. Baruch Merl. I spoke to the patient and is aware of the appt. [name]   Phone# [phone]

## 2023-06-06 ENCOUNTER — Encounter (RURAL_HEALTH_CENTER): Payer: Self-pay | Admitting: Family Medicine

## 2023-06-10 ENCOUNTER — Other Ambulatory Visit (RURAL_HEALTH_CENTER): Payer: Self-pay | Admitting: Family Medicine

## 2023-06-10 DIAGNOSIS — M79672 Pain in left foot: Secondary | ICD-10-CM

## 2023-06-11 ENCOUNTER — Telehealth (RURAL_HEALTH_CENTER): Payer: Self-pay | Admitting: Family Medicine

## 2023-06-11 NOTE — Telephone Encounter (Signed)
George Vincent has an appt 06/26/2023 @ 1 with [facility]. I spoke to the patient and is aware of the appt. [name]  Phone# [phone]

## 2023-06-12 ENCOUNTER — Telehealth (RURAL_HEALTH_CENTER): Payer: Self-pay | Admitting: Family Medicine

## 2023-06-12 NOTE — Telephone Encounter (Signed)
I received a fax from [facility] saying patient must have physical therapy before appt is made. Also requesting MRI or CT scan reports of the problem area less than 59 years old which I do not see any. [name]

## 2023-07-01 ENCOUNTER — Encounter (RURAL_HEALTH_CENTER): Payer: Self-pay | Admitting: Family Medicine

## 2023-07-09 ENCOUNTER — Encounter (RURAL_HEALTH_CENTER): Payer: Self-pay | Admitting: Family Medicine

## 2023-07-09 ENCOUNTER — Other Ambulatory Visit (RURAL_HEALTH_CENTER): Payer: Self-pay | Admitting: Family Medicine

## 2023-07-09 DIAGNOSIS — M5417 Radiculopathy, lumbosacral region: Secondary | ICD-10-CM

## 2023-07-10 ENCOUNTER — Telehealth (RURAL_HEALTH_CENTER): Payer: Self-pay | Admitting: Family Medicine

## 2023-07-10 NOTE — Telephone Encounter (Signed)
[employer] required authorization for the MRI Lumbar spine, BJYN#[id number] valid from 07/10/23-08/08/23. I faxed the authorization, order and demos to [facility]. I called Mr. Biggins and told him that the MRI was authorized and everything has been sent to [facility]. He wanted to schedule the MRI himself. He said he would call and get that scheduled. Dbowman

## 2023-07-11 ENCOUNTER — Encounter (RURAL_HEALTH_CENTER): Payer: Self-pay | Admitting: Family Medicine

## 2023-07-14 ENCOUNTER — Other Ambulatory Visit (RURAL_HEALTH_CENTER): Payer: Self-pay | Admitting: Family Medicine

## 2023-07-14 DIAGNOSIS — M47816 Spondylosis without myelopathy or radiculopathy, lumbar region: Secondary | ICD-10-CM

## 2023-07-14 DIAGNOSIS — M51369 Other intervertebral disc degeneration, lumbar region without mention of lumbar back pain or lower extremity pain: Secondary | ICD-10-CM

## 2023-07-15 ENCOUNTER — Telehealth (RURAL_HEALTH_CENTER): Payer: Self-pay | Admitting: Family Medicine

## 2023-07-15 NOTE — Telephone Encounter (Signed)
George Vincent has an appt 08/05/2023 @ 2:15 with [facility] in [place]. I spoke to the patient and is aware of the appt. [name]  Phone# [phone]  Fax# [phone]

## 2023-10-03 NOTE — Progress Notes (Addendum)
 CARE TEAM:  Patient Care Team:  Pcp, No as PCP - General (General Practice)    ASSESSMENT  1. Lumbar radiculopathy    2. Chronic bilateral low back pain without sciatica       Patient Active Problem List   Diagnosis   . Hypertensive disorder   . Other abnormal involuntary movements   . Pain in thoracic spine   . Thoracic radiculopathy         PLAN    Patient seen today for above diagnoses. Discussed diagnosis and treatment options with the patient. Agree upon plan with patient listed below.    Date: 08/25/23    Work Restrictions: No work restrictions  The patient presents with chronic low back pain that insidiously onset last January of 2024 and has persisted since with shooting pain radiating down his BLE, past his knees, with numbness throughout, as well as chronic right sided mid back pain that has been ongoing since a work injury ~4 years ago.  MRI of the lumbar spine shows multilevel degenerative changes and arthritis, worst from L4/5 and L5/S1. 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    Discussed continuing conservative treatment, focusing on injections provided by pain management.   Referred to pain management (in [place] ) for Left L4/5, L5/S1 TFESI.  Continue physical therapy exercises at home.  Patient will reach out in 8 weeks.    Updated Evaluation and treatment plan     Date: 10/03/2023    Work: No restriction  Updated Subjective: This was a telephone encounter.  As the patient lives 4 hours away and was unable to return today the patient notes that he received to the left-sided L4-5 L5-S1 TFESI on 09/12/2023.  He noted amazing improvement in his symptoms for 3 days.  Stating he had no more left leg pain back pain or leg stiffness.  However quickly after that his symptoms returned.  He states now they are on sustainable at this level.  And that no medication or relaxation is improving the symptoms.  He denies bowel or bladder symptoms.  But notes intense  pain rating down the leg and difficulty ambulating.  MRI continues to show severe foraminal     Patient seen today for above diagnoses. Discussed diagnosis and treatment options with the patient. Agree upon plan with patient listed below.    25 minutes spent on phone with patient  Discussed the patient's smoking cessation counseling including different options for cessation including medication quitting cold [place].  Also discussed concern for wound infection and dehiscence if patient continues to smoke.  Patient is going to consider these options and plans to cease smoking.  Prior to surgery.  7 minutes total spent.  Long discussion had today with the patient regarding the results of this epidural injection.  The significant improvement for several days after the injection at the levels indicated significantly confirm the theory of stenosis as the cause of the patient's radicular symptoms  We discussed possible surgical nonsurgical options moving forward  Patient understands the risks and benefits of surgery at this time  Long discussion has a with patient regarding operative and nonoperative treatment options as well as the risks and benefits of operative and non operative options.  Risks of surgery including bleeding infection nerve vascular damage continued pain numbness tingling weakness limb loss paralysis and death were all discussed with patient.  Also discussed fact these are not the only limiting risks of the surgery.  Also  discussed the extensive amount of benefits associated with the procedure.  At this point time patient understands risks and benefits understands surgical procedure wishes to move forward at this time.  Given the fact that the stenosis is in the foraminal and extraforaminal region on the left-hand side it would be extremely difficult to decompress this area without extensive bony removal including facetectomies.  This would necessitate iatrogenic instability requiring a  fusion.  Therefore the decision was made to move forward with ALIF L4-S1 with posterior percutaneous fusion possible decompression L4-S1  Follow-up for preop visit    No orders of the defined types were placed in this encounter.         HISTORY OF PRESENT ILLNESS  Chief Complaint: No chief complaint on file.   Age: 58 y.o.  Sex: male     History of present illness: Mr. Driskill presents today for evaluation of chronic low back pain that insidiously onset last January of 2024 and has persisted since with shooting pain radiating down his BLE, past his knees, with numbness throughout, as well as chronic right sided mid back pain that has been ongoing since a work injury ~4 years ago. Denies having any treatment for his low back or radicular symptoms prior to this visit, but has completed 6 months of formal PT for his thoracic spine in the past. It has failed to show any improve and failed to provide him with good and/or prolonged relief. He's had MRIs done of both his thoracic and lumbar spine. Reports 8/10 pain today. Denies weakness in extremities. Denies bowel/bladder incontinence and/or retention.      OBJECTIVE  MUSCULOSKELETAL  Lumbar exam  Tenderness: TTP across right periscapular region  Sensation: Sensation intact to light touch throughout lower extremities  Motor: Full motor throughout lower extremities, 5/5 strength  Deep tendon reflexes: 2+ at bilateral patella, achilles normal  Straight leg raise: Negative  Thigh thrust: Negative  Pelvis compression: Negative  Forton finger sign: Negative  Log Roll: Negative   Ganslean: Negative  Clonus: No clonus appreciated in lower extremities  Babinskis: Down going babinskis in both lower extremities  Hoffman's: Negative    IMAGING / STUDIES         No imaging obtained     LABS      PROCEDURES  Procedures    I, Alphia Jasmine, MD, personally, performed the services described in this documentation, as scribed in my presence, and it is both accurate and  complete.  Scribed by: Maude Sorrel

## 2023-10-29 ENCOUNTER — Ambulatory Visit (RURAL_HEALTH_CENTER): Payer: Self-pay | Admitting: Family Medicine

## 2023-10-30 ENCOUNTER — Ambulatory Visit (RURAL_HEALTH_CENTER): Payer: Self-pay | Admitting: Family Medicine

## 2023-11-08 ENCOUNTER — Other Ambulatory Visit (RURAL_HEALTH_CENTER): Payer: Self-pay | Admitting: Family Medicine

## 2023-11-08 DIAGNOSIS — I10 Essential (primary) hypertension: Secondary | ICD-10-CM

## 2023-11-12 ENCOUNTER — Encounter (RURAL_HEALTH_CENTER): Payer: Self-pay | Admitting: Family Medicine

## 2023-11-12 ENCOUNTER — Ambulatory Visit (RURAL_HEALTH_CENTER): Payer: Self-pay | Attending: Family Medicine | Admitting: Family Medicine

## 2023-11-12 ENCOUNTER — Other Ambulatory Visit: Payer: Self-pay

## 2023-11-12 VITALS — BP 121/71 | HR 72 | Temp 98.2°F | Resp 20 | Ht 69.0 in | Wt 253.1 lb

## 2023-11-12 DIAGNOSIS — M51369 Other intervertebral disc degeneration, lumbar region without mention of lumbar back pain or lower extremity pain: Secondary | ICD-10-CM | POA: Insufficient documentation

## 2023-11-12 DIAGNOSIS — Z01818 Encounter for other preprocedural examination: Secondary | ICD-10-CM | POA: Insufficient documentation

## 2023-11-12 DIAGNOSIS — G629 Polyneuropathy, unspecified: Secondary | ICD-10-CM | POA: Insufficient documentation

## 2023-11-12 MED ORDER — POTASSIUM CHLORIDE ER 10 MEQ CAPSULE,EXTENDED RELEASE: 10.0000 meq | ORAL_CAPSULE | Freq: Every day | ORAL | 1 refills | Status: DC

## 2023-11-12 NOTE — Progress Notes (Addendum)
 [address]  [place] [address]  Dept: [phone]  Dept Fax: [phone]     Lonnie Reth.  1965/09/20  [id number]    Date of Service: 11/12/2023  3:00 PM EDT    Chief complaint:   Chief Complaint   Patient presents with    Pre-op Evaluation       Subjective:     George Vincent. is a 58 y.o. male is here today for preoperative evaluation for  ALIF L4-S1, Posterior Perc fusion L4-S1 surgery planned on 7/8 and 7/9.  He is referred by Dr. Helon.    States that he received the injection which helped x 2 days.   He has chronic ankle pain and his arches hurt constantly.   Has seen cardiology in the past for cardiac clearance. Reports work up was negative. No chest pain or shortness of breath.   BP Averages:   Jjune 116/78  May 116/78  April 119/77  March 116/78  February 119/79  Patient has no cardiac or pulmonary history that would preclude them from this surgery.  Patient has had surgery before and had no issues with anesthesia.  Patient is capable of performing  daily life tasks such as : Light housework (such as dusting, washing dishes), Climb a flight of stairs or Walking on level ground at 15 minutes per mile     Has anyone in your family (blood relatives) had a problem following an anaesthetic? No  Do you have any bleeding problems?  No  Do you have kidney disease?  No  Have you ever had a heart attack?  No  Have you ever been diagnosed with an irregular heartbeat?  No  Have you ever had a stroke?  No  Do you have significant neck arthritis?  No  Do you have liver disease?  No  Have you ever been diagnosed with heart failure?  No  Do you suffer from asthma?  No  Do you have diabetes that requires insulin?  No    Past Medical History:   Diagnosis Date    History of kidney stones     Hypertension     Inguinal hernia          Past Surgical History:   Procedure Laterality Date    COLONOSCOPY      Dr Girard    INGUINAL HERNIA REPAIR      right 03/2016, left 12/2020         hydroCHLOROthiazide   (HYDRODIURIL ) 12.5 mg Oral Tablet, TAKE 1 TABLET ONCE DAILY  valsartan  (DIOVAN ) 80 mg Oral Tablet, Takes one tablet morning and 1/2 in evening (Patient taking differently: Take 0.5 Tablets (40 mg total) by mouth Twice daily Taking 1/2 twice a day)    No facility-administered medications prior to visit.     OB History   No obstetric history on file.     Family Medical History:       Problem Relation (Age of Onset)    Brain cancer Mother    Diabetes Mother    Hypertension (High Blood Pressure) Mother, Father    Stroke Mother    Thyroid  Disease Mother            Social History     Socioeconomic History    Marital status: Single     Spouse name: Not on file    Number of children: Not on file    Years of education: Not on file    Highest education level: Not on file  Occupational History    Not on file   Tobacco Use    Smoking status: Never    Smokeless tobacco: Never   Vaping Use    Vaping status: Never Used   Substance and Sexual Activity    Alcohol use: Never    Drug use: Never    Sexual activity: Not on file   Other Topics Concern    Not on file   Social History Narrative    Not on file     Social Determinants of Health     Financial Resource Strain: Not on file   Transportation Needs: Not on file   Social Connections: Not on file   Intimate Partner Violence: Not on file   Housing Stability: Not on file        Review of Systems:  Other than ROS in the HPI, all other systems were negative.    Objective:     Vitals: BP 121/71 (Site: Right Arm, Patient Position: Sitting, Cuff Size: Adult Large)   Pulse 72   Temp 36.8 C (98.2 F)   Resp 20   Ht 1.753 m (5' 9)   Wt 115 kg (253 lb 2 oz)   SpO2 96%   BMI 37.38 kg/m       Appearance: in no apparent distress, well developed and well nourished, in no respiratory distress and acyanotic, alert, oriented times 3, and normal vitals  Exam: Physical Exam  Vitals and nursing note reviewed.   Constitutional:       General: He is not in acute distress.     Appearance: Normal  appearance.   HENT:      Nose: Nose normal.      Mouth/Throat:      Mouth: Mucous membranes are moist.      Pharynx: No oropharyngeal exudate or posterior oropharyngeal erythema.   Eyes:      Pupils: Pupils are equal, round, and reactive to light.   Neck:      Vascular: No carotid bruit.   Cardiovascular:      Rate and Rhythm: Normal rate and regular rhythm.      Pulses: Normal pulses.      Heart sounds: Normal heart sounds. No murmur heard.  Pulmonary:      Effort: Pulmonary effort is normal.      Breath sounds: Normal breath sounds.   Abdominal:      General: Bowel sounds are normal.      Palpations: Abdomen is soft.      Tenderness: There is no abdominal tenderness. There is no guarding.   Musculoskeletal:         General: No swelling or deformity.      Cervical back: Neck supple. No tenderness.      Right lower leg: No edema.      Left lower leg: No edema.   Lymphadenopathy:      Cervical: No cervical adenopathy.   Skin:     General: Skin is warm and dry.   Neurological:      General: No focal deficit present.      Mental Status: He is alert and oriented to person, place, and time.   Psychiatric:         Mood and Affect: Mood normal.         Thought Content: Thought content normal.        Pre-op labs ordered and completed on 11/03/2023 reviewed.   EKG obtained which shows NSR, inferior infarct, age undetermined.  Assessment:     Assessment/Plan   1. Pre-op evaluation    2. Degenerative disc disease, lumbar    3. Neuropathy (CMS HCC)          Plan:     Preoperative Consultation for Dr. Helon. Patient has 0  risk factors for major cardiac complications by the Revised Cardiac Risk Index. This puts the patient at approximately 0.4% risk of a major cardiac complication during the planned procedure.  Patient has been informed of this risk, is willing to proceed.     The Revised Lee's Cardiac Risk Index  Criteria Points   High-risk surgery (e.g., Suprainguinal vascular surgery, intrathoracic surgery, or  intra-abdominal surgery) 1   Ischemic heart disease 1   History of congestive heart failure 1   History of cerebrovascular disease 1   Insulin therapy for diabetes 1   Perioperative serum creatinine >2.0 mg/dL (>822 mol/L) 1     Do not take any medications on the morning of surgery unless otherwise advised by orthopedics. Stop all NSAID'S including ibuprofen, Excedrin, aleve  and ASA 7-10 days prior to the surgery.   Cardiac event rates  Lee index score Risk   0 0.4%   1 0.9%   2 6.6%   >=3 11%         Return as previously scheduled.    Copy of this note will be forwarded to his referring physician.    Harlene Spire, DO 11/12/2023, 15:04    PRE-OPERATIVE ASSESSMENT

## 2023-11-17 ENCOUNTER — Encounter (RURAL_HEALTH_CENTER): Payer: Self-pay | Admitting: Family Medicine

## 2023-11-24 ENCOUNTER — Ambulatory Visit (RURAL_HEALTH_CENTER): Payer: Self-pay | Admitting: Family Medicine

## 2023-11-24 NOTE — Progress Notes (Signed)
 CARE TEAM:  Patient Care Team:  Pcp, No as PCP - General (General Practice)    ASSESSMENT  1. Lumbar pain    2. Lumbar radiculopathy       Patient Active Problem List   Diagnosis   . Hypertensive disorder   . Pain in thoracic spine   . Thoracic radiculopathy         PLAN    Patient seen today for above diagnoses. Discussed diagnosis and treatment options with the patient. Agree upon plan with patient listed below.    Date: 08/25/23    Work Restrictions: No work restrictions  The patient presents with chronic low back pain that insidiously onset last January of 2024 and has persisted since with shooting pain radiating down his BLE, past his knees, with numbness throughout, as well as chronic right sided mid back pain that has been ongoing since a work injury ~4 years ago.  MRI of the lumbar spine shows multilevel degenerative changes and arthritis, worst from L4/5 and L5/S1. 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    Discussed continuing conservative treatment, focusing on injections provided by pain management.   Referred to pain management (in [place] ) for Left L4/5, L5/S1 TFESI.  Continue physical therapy exercises at home.  Patient will reach out in 8 weeks.    Updated Evaluation and treatment plan     Date: 10/03/2023    Work: No restriction  Updated Subjective: This was a telephone encounter.  As the patient lives 4 hours away and was unable to return today the patient notes that he received to the left-sided L4-5 L5-S1 TFESI on 09/12/2023.  He noted amazing improvement in his symptoms for 3 days.  Stating he had no more left leg pain back pain or leg stiffness.  However quickly after that his symptoms returned.  He states now they are on sustainable at this level.  And that no medication or relaxation is improving the symptoms.  He denies bowel or bladder symptoms.  But notes intense pain rating down the leg and difficulty ambulating.  MRI continues to show  severe foraminal     Patient seen today for above diagnoses. Discussed diagnosis and treatment options with the patient. Agree upon plan with patient listed below.    25 minutes spent on phone with patient  Discussed the patient's smoking cessation counseling including different options for cessation including medication quitting cold [place].  Also discussed concern for wound infection and dehiscence if patient continues to smoke.  Patient is going to consider these options and plans to cease smoking.  Prior to surgery.  7 minutes total spent.  Long discussion had today with the patient regarding the results of this epidural injection.  The significant improvement for several days after the injection at the levels indicated significantly confirm the theory of stenosis as the cause of the patient's radicular symptoms  We discussed possible surgical nonsurgical options moving forward  Patient understands the risks and benefits of surgery at this time  Long discussion has a with patient regarding operative and nonoperative treatment options as well as the risks and benefits of operative and non operative options.  Risks of surgery including bleeding infection nerve vascular damage continued pain numbness tingling weakness limb loss paralysis and death were all discussed with patient.  Also discussed fact these are not the only limiting risks of the surgery.  Also discussed the extensive amount of benefits associated with the procedure.  At  this point time patient understands risks and benefits understands surgical procedure wishes to move forward at this time.  Given the fact that the stenosis is in the foraminal and extraforaminal region on the left-hand side it would be extremely difficult to decompress this area without extensive bony removal including facetectomies.  This would necessitate iatrogenic instability requiring a fusion.  Therefore the decision was made to move forward with ALIF L4-S1 with posterior  percutaneous fusion possible decompression L4-S1  Follow-up for preop visit        Updated Evaluation and treatment plan     Date: 11/24/23    Work: No restrictions  Updated Subjective: Today the patient returns for their pre-op evaluation They note their symptoms are unchanged compared  to previous visit. Continued low back pain radiating down his BLE, shooting past his knees, with numbness throughout, as well as chronic right sided mid back pain. Reports 7/10 pain today.   Updated physical: Sensation intact to light touch, 5/5 strength throughout extremities, TTP across right periscapular region    Patient seen today for above diagnoses. Discussed diagnosis and treatment options with the patient. Agree upon plan with patient listed below.    All questions regarding surgery answered  Patient still wishes to move forward with 1. ALIF L4-S1, 2. Posterior percutaneous fusion with possible L4-S1 decompression.  Cordella Xenia Raddle. was prescribed a LSO lumbar orthosis to improve function with daily activities and to reduce pain by restricting mobility of the trunk.  The brace will provide support and stability to the spine and surrounding musculature.  Dannel Alfonzo Jr. was prescribed a walker for   1. Lumbar pain    2. Lumbar radiculopathy    .  This mobility device is required because they have a mobility limitation that significantly impairs their ability to participate in mobility-related activities of daily living.  Cordella Xenia Raddle. has demonstrated that they can safely use this device and their mobility deficit can be sufficiently resolved with the use of the walker.  Discussed the importance of smoking caseation with patient. They are aware that surgery can not be performed with nicotine in the blood.   Oxycodone  for pain.  Colace for bowel movements.  Of note, patient will be staying in the hospital until Friday due to social issues.   Follow up 2 weeks post op.      Orders Placed This Encounter   . BP  Patient Education   . BMI Patient Education          HISTORY OF PRESENT ILLNESS  Chief Complaint: Pain of the Lower Back   Age: 58 y.o.  Sex: male     History of present illness: Mr. Tavano presents today for evaluation of chronic low back pain that insidiously onset last January of 2024 and has persisted since with shooting pain radiating down his BLE, past his knees, with numbness throughout, as well as chronic right sided mid back pain that has been ongoing since a work injury ~4 years ago. Denies having any treatment for his low back or radicular symptoms prior to this visit, but has completed 6 months of formal PT for his thoracic spine in the past. It has failed to show any improve and failed to provide him with good and/or prolonged relief. He's had MRIs done of both his thoracic and lumbar spine. Reports 8/10 pain today. Denies weakness in extremities. Denies bowel/bladder incontinence and/or retention.      OBJECTIVE  MUSCULOSKELETAL  Lumbar exam  Tenderness: TTP  across right periscapular region  Sensation: Sensation intact to light touch throughout lower extremities  Motor: Full motor throughout lower extremities, 5/5 strength  Deep tendon reflexes: 2+ at bilateral patella, achilles normal  Straight leg raise: Negative  Thigh thrust: Negative  Pelvis compression: Negative  Forton finger sign: Negative  Log Roll: Negative   Ganslean: Negative  Clonus: No clonus appreciated in lower extremities  Babinskis: Down going babinskis in both lower extremities  Hoffman's: Negative    IMAGING / STUDIES         No imaging obtained     LABS      PROCEDURES  Procedures    I, Ludie Cornwall, MD, personally, performed the services described in this documentation, as scribed in my presence, and it is both accurate and complete.  Scribed by: Onalee Banter

## 2023-12-08 ENCOUNTER — Other Ambulatory Visit (HOSPITAL_COMMUNITY): Payer: Self-pay

## 2023-12-08 DIAGNOSIS — M5416 Radiculopathy, lumbar region: Secondary | ICD-10-CM

## 2023-12-09 ENCOUNTER — Other Ambulatory Visit: Payer: Self-pay

## 2023-12-09 ENCOUNTER — Inpatient Hospital Stay (HOSPITAL_COMMUNITY)
Admission: RE | Admit: 2023-12-09 | Discharge: 2023-12-09 | Disposition: A | Discharge status: Still In Hospital | Payer: Self-pay | Source: Ambulatory Visit

## 2023-12-09 DIAGNOSIS — M5416 Radiculopathy, lumbar region: Secondary | ICD-10-CM

## 2023-12-09 DIAGNOSIS — Z981 Arthrodesis status: Secondary | ICD-10-CM | POA: Insufficient documentation

## 2023-12-09 NOTE — PT Evaluation (Signed)
 [facility] Medicine [facility]  Outpatient Physical Therapy  [address]  [place], [address]  [phone]  (Fax) [phone]      Physical Therapy Lumbar Evaluation    Date: 12/09/2023  Patient's Name: George Vincent.  Date of Birth: 27-Jul-1965  Physical Therapy Evaluation      Evaluating Physical Therapist: Chauncey Kalata PT, DPT   PT diagnosis/Reason for Referral: ALIF L4-S1   Next Scheduled Physician Appointment: 12/15/23  Allergies/Contraindications: Limit bending, lifting and twisting. Slow gentle movements only, < 10#              SUBJECTIVE  Date of onset: Symptoms began in July of last year. DOS: 7/8 and 12/03/23     Mechanism of injury: Insidious, ALIF L4-S1    Current Presentation: Patient underwent two lumbar surgeries last week. Patient reports first surgery was an anterior approach. Patient reports resolved left ankle pain and 50% to N/T in left toes. Day 2 of surgery was a posterior approach. Patient reports significant spasming/cramping after surgery. Patient reports this lasted for one hour after surgery and went across his low back and down both legs to the knees. Patient reports only mild cramping at this time. Patient reports he is walking a mile per day. Patient reports no pain. Patient reports no pain medication use since surgery, just tylenol  every six hours. Patient reports good tolerance to stair navigation.     PLOF: LBP with radicular symptoms since July of 2024.     Previous episodes/treatments: Previous injections     Past Medical History:   Past Medical History:   Diagnosis Date    History of kidney stones     Hypertension     Inguinal hernia          Past Surgical History:   Past Surgical History:   Procedure Laterality Date    COLONOSCOPY      Dr Girard    INGUINAL HERNIA REPAIR      right 03/2016, left 12/2020       Medications for this problem: anti-inflammatory    Diagnostic tests: CT scan 11/03/23  IMPRESSION:                       1.  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           2.  Please refer to full report for further details and additional           findings.       Patient goals: REDUCE PAIN, RETURN TO WORK, and NORMALIZE FUNCTION    Occupation: Works at [employer].     Pain location: Cramping in  low back and down BLE above knee            Pain description: Cramping    Pain frequency:  INTERMITTENT    Pain rating: Now 0/10   Best 0/10   Worst 1/10    Radiculopathy: Cramping BLE     Pain increases with: Patient denies           decreases with : SUPINE POSITION    Sensation: Patient reports numbness in left foot and ankle. Right heel numbness    Weakness: Patient denies sensations of LE weakness     Sleep affected: Patient notes good tolerance to sleeping at this time. Patient is sleeping on his back. Patient reports laying  on his side would trigger cramping sensations.    Bowel/bladder problems:Patient denies    Subjective Functional Reports:    Sitting: LIMITED    Standing: LIMITED    Walking: LIMITED    Lifting: LIMITED          OBJECTIVE      AROM   Right IE  Left IE    Flexion 45 NT   Extension 0 NT   Sidebend 5 5   Rotation NT NT   ROM comments:    Strength (Manual Muscle Testing per [employer]cle Grading system)     Right IE  Left IE    Hip flexion (L1,2) 4+/5 4+/5   Knee flexion (S1) 5/5 5/5   Knee extension (L2-4) 5/5 5/5   Ankle DF (L4-5) 5/5 5/5   Great toe ext  5/5 5/5   Ankle PF (S1,2) 5/5 5/5   Strength comments:    Palpation: Not assessed     Joint mobility: Not assessed.     Posture: Wearing lumbar brace, good neutral posture.     Gait: NO ASSISTIVE DEVICE, RECIPROCATING STEPS WITH SYMMETRIC STRIDE LENGTH, and NORMAL BOS    Reflexes    Patellar 2+ B   Achilles 2+ B   Clonus LE  ( - )      Special tests:   ( - ) piriformis test  ( - ) SLR       Treatment provided:REVIEW OF POC AND GOALS WITH PATIENT, ALL QUESTIONS ANSWERED, PATIENT EDUCATION, and THERAPEUTIC EXERCISE     HEP  Access Code: [facility]  URL: [email or web address]  Date: 12/09/2023  Prepared by: Chauncey Kalata    Exercises  - Sit to Stand with Arms Crossed  - 1 x daily - 7 x weekly - 3 sets - 10 reps  - Standing Hip Abduction with Counter Support  - 1 x daily - 7 x weekly - 3 sets - 10 reps  - Standing March with Counter Support  - 1 x daily - 7 x weekly - 3 sets - 10 reps  - Heel Raises with Counter Support  - 1 x daily - 7 x weekly - 3 sets - 10 reps  - Seated Piriformis Stretch with Trunk Bend  - 1 x daily - 7 x weekly - 1 sets - 10 reps - 10-15 seconds hold  - Seated Hip Abduction with Resistance  - 1 x daily - 7 x weekly - 3 sets - 10 reps    EXERCISE/ACTIVITY NAME REPETITIONS RESISTANCE COMPLETED THIS DOS   HEP Review   Y   Nu-step warm up       STS       Step ups       Step downs       Tandem gait       SLS       PPT       Assisted SKTC       Seated marching       Seated piriformis stretch                 ASSESSMENT    Impression: Patient is a 58 year old male referred to PT s/p ALFI L4-S1. Static assessment revealed: Patient is wearing lumbar brace. Dynamic assessment revealed: decreased pain free lumbar AROM, impaired bed mobility and reduced hip flexion strength. Will progress per physician recommendations.  Patient will benefit from skilled PT to reduce subjective complaints, maximize ROM returns, improve core/LE strength and improve functional  activity tolerance in order to optimize functional mobility and maximize functional independence.      Rehab potential: GOOD      Short-Term Goals: 4 Weeks     - Patient will report no more LE cramping     - Patient will be independent with progressive HEP to maximize gains from PT.     - Patient will report max 2 LBP to aid in participation in PT.     - Patient will report tolerance to reaching overhead     - Patient will exhibit functional trunk mobility and strength to allow non-limiting bed mobility activities due to pain.      Long-Term Goals: 8 Weeks     - Patient  will demonstrate improved BLE strength of at least 5/5 to aid in functional transfers.     - Patient will demonstrate 5 xSTS < 11 seconds    - Patient will demonstrate 30 sec STS >15    - Patient will report sleep not disrupted by back pain to aid in participation of functional ADLs during the day.     - Patient will report abolishment of radicular symptoms with worst subjective pain <=2/10 to allow return to community ambulation not limited by pain.     PLAN  Patient will attend 2 times per week x 8 weeks. Therapy may include, but is not limited to THERAPEUTIC EXERCISES, MYOFASCIAL/JOINT MOBILIZATION, POSTURE/BODY MECHANICS, TRANSFER/GAIT TRAINING, HOME INSTRUCTIONS, HEAT/COLD, KINESIOTAPE, and DRY NEEDLING    Plan for next visit: Progress per flowsheet     Evaluation complexity:   Personal factors impacting POC: FREQUENT OR CHRONIC PAIN   Co-morbidities impacting POC: PREVIOUS SURGERIES  Complexity of physical exam: INCLUDING MUSCULOSKELETAL SYSTEM (POSTURE, ROM, STRENGTH, HEIGHT/WEIGHT), INCLUDING NEUROMUSCULAR EXAM (BALANCE, GAIT, LOCOMOTION, MOBILITY), and INCLUDING ACTIVITY/MOBILITY RESTRICTIONS   Clinical Presentation: STABLE   Evaluation Complexity: LOW-HISTORY 0, EXAMINATION 1-2, STABLE PRESENTATION    Total Session Time 60 and Untimed code minutes 60       Intervention minutes: EVALUATION 60 minutes    Chauncey Kalata, PT  12/09/2023, 10:32

## 2023-12-11 ENCOUNTER — Ambulatory Visit (HOSPITAL_COMMUNITY): Payer: Self-pay

## 2023-12-15 NOTE — Progress Notes (Signed)
 CARE TEAM:  Patient Care Team:  Pcp, No as PCP - General (General Practice)    ASSESSMENT  1. Lumbar radiculopathy    2. Lumbar pain    3. Aftercare       Patient Active Problem List   Diagnosis   . Hypertensive disorder   . Pain in thoracic spine   . Thoracic radiculopathy   . S/P lumbar fusion         PLAN  Patient seen today for above diagnoses. Discussed diagnosis and treatment options with the patient. Agree upon plan with patient listed below.    Date: 12/15/2023    I removed his staples and applied Steri-Strips to the incision on his back on the right side.  For the incision on the left side of his low back that is draining I cleaned it with Betadine and applied a pressure dressing.  Sent in Keflex to patient's pharmacy  Continue BLT restrictions for 4 more weeks  DC LSO  Follow-up with Dr. Helon as scheduled.    Orders Placed This Encounter   . BP Patient Education   . cephalexin (KEFLEX) 500 MG capsule      HISTORY OF PRESENT ILLNESS  Chief Complaint: Post-op of the Spine   Age: 58 y.o.  Sex: male     History of present illness: Mr. Higginson presents today status post day 1: L4-S1 ALIF followed by day 2: L4-S1 posterior percutaneous lumbar fusion performed on 12/02/2023 and 12/03/2023, respectively.  Patient is reporting improvement of preoperative symptoms.  He notes that at this time he has low-level cramping left buttock pain going down to his calf.  He states that the numbness was improved about 75% in his left foot.  He also states that last night he was having lots of pain which was keeping him up.  He notes that he has not been taking any of the pain meds and was previously taking Tylenol  but he is no longer even doing that.  He notes that he was doing very well until about yesterday when he started to notice a lot of drainage coming from his incision.  He denies any f/c/n/v.     Pain rating: 2  out of 10.   OBJECTIVE    Musculoskeletal   Lumbar exam  Incision: Both pinhole incision and the incision on  the right side of his low back are healing well without erythema or drainage.  The incision on the left side is this is without erythema but there was a significant amount of serosanguineous drainage coming from this incision.  Sensation: Sensation intact to light touch throughout lower extremities   Motor: Full motor throughout lower extremities       IMAGING / STUDIES         No imaging obtained     LABS      PROCEDURES  Procedures    Loria Daft, PA-C  Supervising physician: Ludie Helon, MD

## 2023-12-17 ENCOUNTER — Ambulatory Visit (HOSPITAL_COMMUNITY): Payer: Self-pay

## 2023-12-18 ENCOUNTER — Other Ambulatory Visit: Payer: Self-pay

## 2023-12-18 ENCOUNTER — Ambulatory Visit
Admission: RE | Admit: 2023-12-18 | Discharge: 2023-12-18 | Disposition: A | Discharge status: Still In Hospital | Payer: Self-pay | Source: Ambulatory Visit

## 2023-12-18 NOTE — PT Treatment (Signed)
 [facility]  Outpatient Physical Therapy  [address]  [place], [address]  [phone]  (Fax) [phone]    Physical Therapy Treatment Note    Date: 12/18/2023  Patient's Name: George Vincent.  Date of Birth: 1966-01-19  Physical Therapy Visit    Visit #/POC: 2/16  Authorization:   POC Signed?: No  POC Ends: 9/9  Order Ends: 9/9  Next Progress Note Due: Visit 8    Evaluating Physical Therapist: Chauncey Kalata PT, DPT   PT diagnosis/Reason for Referral: ALIF L4-S1   Next Scheduled Physician Appointment: 01/19/24  Allergies/Contraindications: Limit bending, lifting and twisting. Slow gentle movements only, < 10#       Subjective: Patient reports drainage from R posterior incision has reduced by 2/3 in the last week. Patient reports he is trying to keep compression on it. Patient reports poor tolerance to HEP. Patient reports increased LBP and left sciatica type pain in the evenings after and significant soreness the following day. Patient reports Sunday night night he experienced a flare up of radicular symptoms following driving to [facility] and increased symptoms Monday night following drive back. Patient reports he took pain medication on Monday night which helped his symptoms.    Objective: Activities per flowsheet     Measured ROM on  (mm/dd/yyyy):         EXERCISE/ACTIVITY NAME REPETITIONS RESISTANCE COMPLETED THIS DOS   HEP Review     Y   Nu-step warm up  5  L 4.0  Y   STS          Step ups          Step downs          Tandem gait       N   SLS       N   PPT   2x10    Y   Manual SKTC   10xea    Y   Seated PPT  2x10  Y   Seated marching   2x10    Y   Seated piriformis stretch             DISCONTINUED ACTIVITIES                              Assessment: Patient demos good tolerance to exercise program this date. Will continue to progress as tolerated per restrictions.     Short-Term Goals: 4 Weeks      - Patient will report no more LE cramping     - Patient will be  independent with progressive HEP to maximize gains from PT.     - Patient will report max 2 LBP to aid in participation in PT.     - Patient will report tolerance to reaching overhead     - Patient will exhibit functional trunk mobility and strength to allow non-limiting bed mobility activities due to pain.       Long-Term Goals: 8 Weeks      - Patient will demonstrate improved BLE strength of at least 5/5 to aid in functional transfers.     - Patient will demonstrate 5 xSTS < 11 seconds    - Patient will demonstrate 30 sec STS >15    - Patient will report sleep not disrupted by back pain to aid in participation of functional ADLs during the day.     - Patient will report abolishment of  radicular symptoms with worst subjective pain <=2/10 to allow return to community ambulation not limited by pain.     Plan: Progress as tolerated    Total Session Time 40 and Timed code minutes 40  THERAPEUTIC EXERCISE 40 minutes      Chauncey Kalata, PT  12/18/2023, 15:09

## 2023-12-23 ENCOUNTER — Ambulatory Visit (HOSPITAL_COMMUNITY): Payer: Self-pay

## 2023-12-25 ENCOUNTER — Ambulatory Visit (HOSPITAL_COMMUNITY): Payer: Self-pay

## 2023-12-30 ENCOUNTER — Ambulatory Visit (HOSPITAL_COMMUNITY)
Admission: RE | Admit: 2023-12-30 | Discharge: 2023-12-30 | Disposition: A | Discharge status: Still In Hospital | Payer: Self-pay | Source: Ambulatory Visit

## 2023-12-30 ENCOUNTER — Other Ambulatory Visit: Payer: Self-pay

## 2023-12-30 DIAGNOSIS — M5416 Radiculopathy, lumbar region: Secondary | ICD-10-CM | POA: Insufficient documentation

## 2023-12-30 NOTE — PT Treatment (Signed)
 [facility]  Outpatient Physical Therapy  [address]  [place], [address]  [phone]  (Fax) [phone]    Physical Therapy Treatment Note    Date: 12/30/2023  Patient's Name: George Vincent.  Date of Birth: [birth year]  Physical Therapy Visit    Visit #/POC: 3/16  Authorization:   POC Signed?: No  POC Ends: 9/9  Order Ends: 9/9  Next Progress Note Due: Visit 8     Evaluating Physical Therapist: Chauncey Kalata PT, DPT   PT diagnosis/Reason for Referral: ALIF L4-S1   Next Scheduled Physician Appointment: 01/19/24  Allergies/Contraindications: Limit bending, lifting and twisting. Slow gentle movements only, < 10#         Subjective: Patient reports some of his pre surgery symptoms are beginning to return but notes reduced severity. Patient reports right glute pain that radiates down his thigh into his calf and arch of his left foot. Patient reports numbness across the tips of his four toes. Patient reports this has began to slowly comeback over the last week. Patient reports poor tolerance to standing noting radicular symptoms occur within less than 5 minutes. Patient notes he is only having minimal drainage at this time.      Objective: Activities per flowsheet      Measured ROM on  (mm/dd/yyyy):            EXERCISE/ACTIVITY NAME REPETITIONS RESISTANCE COMPLETED THIS DOS   HEP Review     Y   Nu-step warm up  5  L 4.0  N   PPT   2x10    Y   PPT with march 2x10  Y   DKTC with ball ( gentle )  20x  Y   Small range bridge with ball 2x15  Y   Manual SKTC   10xea    Y   Manual piriformis S  3x30ea  Y   Seated PPT  2x10   N   Seated marching   2x10   N   Standing hip ext small range   3x10ea   y   Seated Anti-rotation with manual perturbations  20xea Black  y   STS with PB TVA activation  20x  y           DISCONTINUED ACTIVITIES                                                Assessment: Patient continues to demo good tolerance to exercise program. Patient notes mild lumbar  discomfort with initial reps of PPT and small range bridges which resolve with subsequent reps.      Short-Term Goals: 4 Weeks      - Patient will report no more LE cramping     - Patient will be independent with progressive HEP to maximize gains from PT.     - Patient will report max 2 LBP to aid in participation in PT.     - Patient will report tolerance to reaching overhead     - Patient will exhibit functional trunk mobility and strength to allow non-limiting bed mobility activities due to pain.       Long-Term Goals: 8 Weeks      - Patient will demonstrate improved BLE strength of at least 5/5 to aid in functional transfers.     - Patient will demonstrate  5 xSTS < 11 seconds    - Patient will demonstrate 30 sec STS >15    - Patient will report sleep not disrupted by back pain to aid in participation of functional ADLs during the day.     - Patient will report abolishment of radicular symptoms with worst subjective pain <=2/10 to allow return to community ambulation not limited by pain.      Plan: Progress as tolerated       Total Session Time 43 and Timed code minutes 43  THERAPEUTIC EXERCISE 43 minutes      Chauncey Kalata, PT  12/30/2023, 07:52

## 2023-12-31 ENCOUNTER — Ambulatory Visit (HOSPITAL_COMMUNITY)
Admission: RE | Admit: 2023-12-31 | Discharge: 2023-12-31 | Disposition: A | Discharge status: Still In Hospital | Source: Ambulatory Visit

## 2023-12-31 NOTE — PT Treatment (Signed)
 [facility]  Outpatient Physical Therapy  [address]  [place], [address]  [phone]  (Fax) [phone]    Physical Therapy Treatment Note    Date: 12/31/2023  Patient's Name: George Vincent.  Date of Birth: Oct 16, 1965  Physical Therapy Visit    Visit #/POC: 4/16  Authorization:   POC Signed?: No  POC Ends: 9/9  Order Ends: 9/9  Next Progress Note Due: Visit 8     Evaluating Physical Therapist: Chauncey Kalata PT, DPT   PT diagnosis/Reason for Referral: ALIF L4-S1   Next Scheduled Physician Appointment: 01/19/24  Allergies/Contraindications: Limit bending, lifting and twisting. Slow gentle movements only, < 10#         Subjective: Patient reports he is having some of sciatic symptoms prior to today's session.     Objective: Activities per flowsheet      Measured ROM on  (mm/dd/yyyy):             EXERCISE/ACTIVITY NAME REPETITIONS RESISTANCE COMPLETED THIS DOS   HEP Review     N   Nu-step warm up  5  L 4.0  N   PPT  2x10    Y   PPT with march 2x10   Y   DKTC with ball ( gentle )  30x   Y   Small range bridge with ball 2x15   Y   Manual SKTC  5x30ea    Y   Manual piriformis S  5x30ea   Y   Seated PPT  2x10   N   Seated marching   2x10   N   Standing hip ext small range   3x10ea   y   Seated Anti-rotation with manual perturbations  20xea Black  y   STS with PB TVA activation  20x   y           DISCONTINUED ACTIVITIES                                                Assessment: Patient demos good tolerance to exercise program this date. Progression held due to PT on back to back days. Patient denies increased pain throughout session.      Short-Term Goals: 4 Weeks      - Patient will report no more LE cramping     - Patient will be independent with progressive HEP to maximize gains from PT.     - Patient will report max 2 LBP to aid in participation in PT.     - Patient will report tolerance to reaching overhead     - Patient will exhibit functional trunk mobility and  strength to allow non-limiting bed mobility activities due to pain.       Long-Term Goals: 8 Weeks      - Patient will demonstrate improved BLE strength of at least 5/5 to aid in functional transfers.     - Patient will demonstrate 5 xSTS < 11 seconds    - Patient will demonstrate 30 sec STS >15    - Patient will report sleep not disrupted by back pain to aid in participation of functional ADLs during the day.     - Patient will report abolishment of radicular symptoms with worst subjective pain <=2/10 to allow return to community ambulation not limited by pain.  Plan: Progress as tolerated per protocol       Total Session Time 40 and Timed code minutes 40  THERAPEUTIC EXERCISE 40 minutes      Chauncey Kalata, PT  12/31/2023, 13:38

## 2024-01-01 ENCOUNTER — Ambulatory Visit (HOSPITAL_COMMUNITY): Payer: Self-pay

## 2024-01-06 ENCOUNTER — Ambulatory Visit (HOSPITAL_COMMUNITY): Payer: Self-pay

## 2024-01-08 ENCOUNTER — Other Ambulatory Visit: Payer: Self-pay

## 2024-01-08 ENCOUNTER — Ambulatory Visit (HOSPITAL_COMMUNITY)
Admission: RE | Admit: 2024-01-08 | Discharge: 2024-01-08 | Disposition: A | Discharge status: Still In Hospital | Payer: Self-pay | Source: Ambulatory Visit

## 2024-01-08 NOTE — PT Treatment (Signed)
 [facility]  Outpatient Physical Therapy  [address]  [place], [address]  [phone]  (Fax) [phone]    Physical Therapy Treatment Note    Date: 01/08/2024  Patient's Name: George Vincent.  Date of Birth: Apr 08, 1966  Physical Therapy Visit    Visit #/POC: 5/16  Authorization:   POC Signed?: No  POC Ends: 9/9  Order Ends: 9/9  Next Progress Note Due: Visit 8     Evaluating Physical Therapist: Chauncey Kalata PT, DPT   PT diagnosis/Reason for Referral: ALIF L4-S1   Next Scheduled Physician Appointment: 01/19/24  Allergies/Contraindications: Limit bending, lifting and twisting. Slow gentle movements only, < 10#         Subjective: Patient reports he continues to have same symptoms. Patient reports B foot pain and off/on sciatic type pain. Patient continues to note good tolerance to HEP and PT treatment sessions.      Objective: Activities per flowsheet      Measured ROM on  (mm/dd/yyyy):             EXERCISE/ACTIVITY NAME REPETITIONS RESISTANCE COMPLETED THIS DOS   HEP Review     N   Nu-step warm up  5  L 4.0  N   PPT  2x10    Y   PPT with march 2x10   Y   DKTC with ball ( gentle )  30x   Y   Small range bridge with ball 2x15   Y   Manual SKTC  5x30ea    Y   Manual piriformis S  5x30ea   Y   Seated PPT  2x10   N   Seated marching   2x10   N   Standing hip ext small range  2x10ea  Red y   Standing hip ABD  2x10 Red  Y   Seated Anti-rotation with manual perturbations  20xea Black  y   STS with PB TVA activation  20x   N           DISCONTINUED ACTIVITIES                                                Assessment: Patient continues to demo good tolerance to exercise program. Patient LBT restrictionsare lifted next week.       Short-Term Goals: 4 Weeks      - Patient will report no more LE cramping     - Patient will be independent with progressive HEP to maximize gains from PT.     - Patient will report max 2 LBP to aid in participation in PT.     - Patient will report  tolerance to reaching overhead     - Patient will exhibit functional trunk mobility and strength to allow non-limiting bed mobility activities due to pain.       Long-Term Goals: 8 Weeks      - Patient will demonstrate improved BLE strength of at least 5/5 to aid in functional transfers.     - Patient will demonstrate 5 xSTS < 11 seconds    - Patient will demonstrate 30 sec STS >15    - Patient will report sleep not disrupted by back pain to aid in participation of functional ADLs during the day.     - Patient will report abolishment of radicular symptoms with  worst subjective pain <=2/10 to allow return to community ambulation not limited by pain.      Plan: Progress as tolerated per protocol       Total Session Time 40 and Timed code minutes 40  THERAPEUTIC EXERCISE 40 minutes      Chauncey Kalata, PT  01/08/2024, 13:52

## 2024-01-09 ENCOUNTER — Other Ambulatory Visit (RURAL_HEALTH_CENTER): Payer: Self-pay | Admitting: Surgery

## 2024-01-13 ENCOUNTER — Ambulatory Visit (HOSPITAL_COMMUNITY)
Admission: RE | Admit: 2024-01-13 | Discharge: 2024-01-13 | Disposition: A | Discharge status: Still In Hospital | Payer: Self-pay | Source: Ambulatory Visit

## 2024-01-13 ENCOUNTER — Other Ambulatory Visit: Payer: Self-pay

## 2024-01-13 NOTE — PT Treatment (Signed)
 [facility] Medicine [facility]  Outpatient Physical Therapy  [address]  [place], [address]  [phone]  (Fax) [phone]    Physical Therapy Treatment Note    Date: 01/13/2024  Patient's Name: George Vincent.  Date of Birth: 1966-03-09  Physical Therapy Visit    Visit #/POC: 6 /16  Authorization:   POC Signed?: No  POC Ends: 9/9  Order Ends: 9/9  Next Progress Note Due: Visit 8     Evaluating Physical Therapist: Chauncey Kalata PT, DPT   PT diagnosis/Reason for Referral: ALIF L4-S1   Next Scheduled Physician Appointment: 01/19/24  Allergies/Contraindications: Slow gentle movements only, < 10#      Subjective: Patient reports his low back is sore prior to today's. Patient reports he was operating a skidster yesterday and notes some soreness from the jarring.     Objective: Activities per flowsheet      Measured ROM on  (mm/dd/yyyy):             EXERCISE/ACTIVITY NAME REPETITIONS RESISTANCE COMPLETED THIS DOS   HEP Review     N   Nu-step warm up  5  L 4.0  N   PPT  2x10   N   PPT with march 2x10   N   DKTC with ball 30x   Y   Small range bridge with ball 2x15   N   Bridge  2x10  Y   LTR 2x10  Y   Lumbar Ball roll out FWD  10x10  Y   Lumbar ball roll out diagonal 10x10ea  Y   Good Posture/Bad posture  10xea  Y   Manual SKTC  5x30ea   N   Manual piriformis S  5x30ea   N   Seated PPT  2x10   N   Seated marching   2x10   N   Standing hip ext small range  2x10ea Red N   Standing hip ABD  2x10 Red  N   Seated Anti-rotation with manual perturbations  20xea Black  N   STS with PB TVA activation  20x   N           DISCONTINUED ACTIVITIES                                                Assessment: Patient demos good tolerance to progression of exercise program this date. Patient denies pain throughout session but does note lumbar tightness.     Short-Term Goals: 4 Weeks      - Patient will report no more LE cramping     - Patient will be independent with progressive HEP to maximize gains from  PT.     - Patient will report max 2 LBP to aid in participation in PT.     - Patient will report tolerance to reaching overhead     - Patient will exhibit functional trunk mobility and strength to allow non-limiting bed mobility activities due to pain.       Long-Term Goals: 8 Weeks      - Patient will demonstrate improved BLE strength of at least 5/5 to aid in functional transfers.     - Patient will demonstrate 5 xSTS < 11 seconds    - Patient will demonstrate 30 sec STS >15    - Patient will report sleep not  disrupted by back pain to aid in participation of functional ADLs during the day.     - Patient will report abolishment of radicular symptoms with worst subjective pain <=2/10 to allow return to community ambulation not limited by pain.      Plan: Progress note next session.        Total Session Time 45 and Timed code minutes 45  THERAPEUTIC EXERCISE 45 minutes      Chauncey Kalata, PT  01/13/2024, 13:44

## 2024-01-15 ENCOUNTER — Other Ambulatory Visit: Payer: Self-pay

## 2024-01-15 ENCOUNTER — Ambulatory Visit (HOSPITAL_COMMUNITY)
Admission: RE | Admit: 2024-01-15 | Discharge: 2024-01-15 | Disposition: A | Discharge status: Still In Hospital | Payer: Self-pay | Source: Ambulatory Visit

## 2024-01-15 NOTE — Progress Notes (Signed)
 [facility] Medicine [facility]  Outpatient Physical Therapy  [address]  [place], [address]  [phone]  (Fax) [phone]    Physical Therapy Progress Note    Date: 01/15/2024  Patient's Name: George Vincent.  Date of Birth: January 09, 1966  Physical Therapy Progress Note     Visit #/POC: 7 /16  Authorization:   POC Signed?: No  POC Ends: 9/9  Order Ends: 9/9  Next Progress Note Due: Visit 8     Evaluating Physical Therapist: Chauncey Kalata PT, DPT   PT diagnosis/Reason for Referral: ALIF L4-S1   Next Scheduled Physician Appointment: 01/19/24  Allergies/Contraindications: Slow gentle movements only, < 10#      Subjective: Patient reports good tolerance to progression of exercise program last session. Patient reports sharp tailbone pain when doing LTR with ball, PPT, and with bridges. Patient reports these were performed in the floor, patient also notes occurrence of this pain with coughing while in the floor. Patient also notes this occurs with scooting. Patient reports these a brief instances of pain that resolve quickly. Patiet rates this pain as a 7/10. Patient reports 0/10 prior to today's session. Patient continues to note sciatic type pain with prolonged standing.     Objective: Activities per flowsheet       AROM    Right IE  Right 8/21 Left IE  Left 8/21   Flexion 45 70 NT NT   Extension 0 10 NT NT   Sidebend 5 12 5 13    Rotation NT NT NT NT   ROM comments:     Strength (Manual Muscle Testing per [employer]all Muscle Grading system)       Right IE  Right 8/21 Left IE  Left 8/21   Hip flexion (L1,2) 4+/5 4+/5 4+/5 5/5   Knee flexion (S1) 5/5 5/5 5/5 5/5   Knee extension (L2-4) 5/5 5/5 5/5 5/5   Ankle DF (L4-5) 5/5 5/5 5/5 5/5   Great toe ext  5/5 5/5 5/5 5/5   Ankle PF (S1,2) 5/5 5/5 5/5 5/5   Strength comments:    Reflexes      IE  8/25   Patellar 2+ B 2+ B   Achilles 2+ B 2+B    Clonus LE  ( - )  ( - )        FUNCTIONAL TESTING:   8/21   5xSTS  11.33 Seconds    30 sec STS  20 Reps         Measured ROM on  (mm/dd/yyyy):             EXERCISE/ACTIVITY NAME REPETITIONS RESISTANCE COMPLETED THIS DOS   HEP Review     N   Nu-step warm up  5  L 4.0  N   PPT  2x10   N   PPT with march 2x10   N   DKTC with ball 30x   Y   Small range bridge with ball 2x15   N   Bridge  2x10   Y   LTR with ball 2x10   Y   Lumbar Ball roll out FWD  10x10   Y   Lumbar ball roll out diagonal 10x10ea   Y   Good Posture/Bad posture  10xea   N   Manual SKTC  5x30ea   N   Manual piriformis S  5x30ea   N   Seated PPT  2x10   N   Seated marching   2x10   N  Standing hip ext small range  2x10ea Red N   Standing hip ABD  2x10 Red  N   Seated Anti-rotation with manual perturbations  20xea Black  N   STS with med ball TVA activation  10x  4.4# Y   Modified Plank  5x5  Y   Supine PPT UE raise with med ball 15x 4.4# Y   Cat/Cow  10x  Y   Bird Dog 10xea  Y           DISCONTINUED ACTIVITIES                                                Assessment: Patient continues to make excellent progress towards established short and long term PT goals. Core strengthening program progress this date with good tolerance. Patient notes difficulty with modified planks but denies pain. Patient ROM, gross muscle strength and functional tolerance improved compared to baseline. Will continue to progress ROM and core strength/stability as tolerated. Patient will continue to benefit from skilled PT to reduce subjective complaints, improve pain free lumbar AROM, improve BLE gross/functional muscle strength, improve tolerance to lifting and improve activity tolerance to allow for return to PLOF.      Short-Term Goals: 4 Weeks      - Patient will report no more LE cramping ( PROGRESSING 01/15/24 )     - Patient will be independent with progressive HEP to maximize gains from PT. ( MET 01/15/24 )     - Patient will report max 2 LBP to aid in participation in PT. ( NOT MET 01/15/24 )     - Patient will report tolerance to reaching overhead ( MET 01/15/24 )     -  Patient will exhibit functional trunk mobility and strength to allow non-limiting bed mobility activities due to pain.  ( PROGRESSING 01/15/24 )      Long-Term Goals: 8 Weeks      - Patient will demonstrate improved BLE strength of at least 5/5 to aid in functional transfers. ( PROGRESSING 01/19/24 )     - Patient will demonstrate 5 xSTS < 11 seconds ( PROGRESSING 01/15/24 )     - Patient will demonstrate 30 sec STS >15 ( MET 01/15/24 )     - Patient will report sleep not disrupted by back pain to aid in participation of functional ADLs during the day. ( MET 01/15/24 )     - Patient will report abolishment of radicular symptoms with worst subjective pain <=2/10 to allow return to community ambulation not limited by pain. ( PROGRESSING 01/15/24 )      Plan: Continue per POC    Total Session Time 40 and Timed code minutes 40  THERAPEUTIC EXERCISE 40 minutes      Chauncey Kalata, PT  01/15/2024, 13:58

## 2024-01-19 NOTE — Progress Notes (Signed)
 CARE TEAM:  Patient Care Team:  Pcp, No as PCP - General (General Practice)    ASSESSMENT  1. Lumbar pain       Patient Active Problem List   Diagnosis    Hypertensive disorder    Pain in thoracic spine    Thoracic radiculopathy    S/P lumbar fusion         PLAN  Patient seen today for above diagnoses. Discussed diagnosis and treatment options with the patient. Agree upon plan with patient listed below.    Date: 12/15/2023    I removed his staples and applied Steri-Strips to the incision on his back on the right side.  For the incision on the left side of his low back that is draining I cleaned it with Betadine and applied a pressure dressing.  Sent in Keflex to patient's pharmacy  Continue BLT restrictions for 4 more weeks  DC LSO  Follow-up with Dr. Helon as scheduled.      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 His pain is no longer radiating down his leg. Continues to complain of infrequent sporadic muscle spasms throughout his LLE, that worsens with the increased stiffness associated with prolonged periods of sitting. However, he took last visit's advise seriously and has been walking much more and avoiding too much sedentary activity, which has helped a lot. Reports 0/10 pain today and overall they are happy with their progress.  Updated physical: Well healing, no drainage, no erythema, Sensation intact to light touch, 5/5 strength throughout extremities, TTP transversely across thoracolumbar spine  I ordered and independently reviewed and interpreted radiographs of the lumbar spine which show well forming fusion mass, hardware well positioned.    Patient seen today for above diagnoses. Discussed diagnosis and treatment options with the patient. Agree upon plan  with patient listed below.    The patient returns, ~7 weeks post-op, noting the near total relief of all his pre-op symptoms and reports minimal back pain and significantly improved radicular symptoms.  Physical therapy in order to strengthen their thoracic spine and P.O. lumbar spine, provide increased range of motion, and mitigate their current symptoms.  Work note stating [employer] starting 02/09/24.  Continue physical therapy exercises at home.  Ease into bending, lifting, and twisting.  Follow up 8 weeks with George Vincent.       Orders Placed This Encounter    X-ray lumbar spine 2 or 3 views    BP Patient Education      HISTORY OF PRESENT ILLNESS  Chief Complaint: Post-op of the Lower Back   Age: 58 y.o.  Sex: male     History of present illness: George Vincent presents today status post day 1: L4-S1 ALIF followed by day 2: L4-S1 posterior percutaneous lumbar fusion performed on 12/02/2023 and 12/03/2023, respectively.  Patient is reporting improvement of preoperative symptoms.  He notes that at this time he has low-level cramping left buttock pain going down to his calf.  He states that the numbness was improved about 75% in his left foot.  He also states that last night he was having lots of pain which was keeping him up.  He notes that he has not been taking any of the pain meds and was previously taking Tylenol   but he is no longer even doing that.  He notes that he was doing very well until about yesterday when he started to notice a lot of drainage coming from his incision.  He denies any f/c/n/v.     Pain rating: 2  out of 10.   OBJECTIVE    Musculoskeletal   Lumbar exam  Incision: Both pinhole incision and the incision on the right side of his low back are healing well without erythema or drainage.  The incision on the left side is this is without erythema but there was a significant amount of serosanguineous drainage coming from this incision.  Sensation: Sensation intact to light touch throughout lower  extremities   Motor: Full motor throughout lower extremities       IMAGING / STUDIES   Order: XR SPINE LUMBAR 2 OR 3 VW - Indication: Lumbar pain      X-ray lumbar spine 2 or 3 views  Result Date: 01/19/2024  Standing. AP, Lat.     Impression: I ordered and independently reviewed and interpreted radiographs of the lumbar spine which show well forming fusion mass, hardware well positioned.      LABS      PROCEDURES  Procedures    George Vincent  Supervising physician: , MD

## 2024-01-20 ENCOUNTER — Other Ambulatory Visit: Payer: Self-pay

## 2024-01-20 ENCOUNTER — Ambulatory Visit (HOSPITAL_COMMUNITY)
Admission: RE | Admit: 2024-01-20 | Discharge: 2024-01-20 | Disposition: A | Discharge status: Still In Hospital | Payer: Self-pay | Source: Ambulatory Visit

## 2024-01-20 NOTE — PT Treatment (Signed)
 [facility] Medicine [facility]  Outpatient Physical Therapy  [address]  [place], [address]  [phone]  (Fax) [phone]    Physical Therapy Treatment Note    Date: 01/20/2024  Patient's Name: George Vincent.  Date of Birth: [birth year]  Physical Therapy Visit    Visit #/POC: 8 /16  Authorization:   POC Signed?: No  POC Ends: 9/9  Order Ends: 9/9  Next Progress Note Due: Visit 8     Evaluating Physical Therapist: Chauncey Kalata PT, DPT   PT diagnosis/Reason for Referral: ALIF L4-S1   Next Scheduled Physician Appointment: 03/07/24  Allergies/Contraindications: Slow gentle movements only, < 10#      Subjective: Patient reports he is sore prior to today's session at belt line. Patient describes this as stiff and achy.      Objective: Activities per flowsheet     Measured ROM on  (mm/dd/yyyy):             EXERCISE/ACTIVITY NAME REPETITIONS RESISTANCE COMPLETED THIS DOS   HEP Review     N   Nu-step warm up  5  L 4.0  N   PPT  2x10   N   PPT with march 2x10   N   DKTC with ball 30x   N   Small range bridge with ball 2x15   N   Bridge  2x10   Y   LTR with ball 2x10   N   Lumbar Ball roll out FWD  10x10   N   Lumbar ball roll out diagonal 10x10ea   N   Good Posture/Bad posture  10xea   N   Manual SKTC  5x30ea   N   Manual piriformis S  5x30ea   N   Seated PPT  2x10   N   Seated marching   2x10   N   Standing hip ext small range  2x10ea Red N   Standing hip ABD  2x10 Red  N   Seated Anti-rotation with manual perturbations  20xea Black  N   STS with med ball TVA activation  10x  4.4# N   Modified Plank  5x5   N   Supine PPT UE raise with med ball 15x 4.4# N   Cat/Cow  10x   N   Bird Dog 10xea   N   Open Books 10xea Elbow bent Y   DKTC 15x  Y   Rep FISIT  15x  Y   Rep EIS 15x  Y   Rep SGIS L and R  10xea  Y           DISCONTINUED ACTIVITIES                                                Assessment: Patient demos good tolerance to progression of exercise program this date. Continuing to work  to improve available range of motion in all planes.Plan to progress core strength next session as tolerated.       Short-Term Goals: 4 Weeks      - Patient will report no more LE cramping ( PROGRESSING 01/15/24 )     - Patient will be independent with progressive HEP to maximize gains from PT. ( MET 01/15/24 )     - Patient will report max 2 LBP to aid in participation in  PT. ( NOT MET 01/15/24 )     - Patient will report tolerance to reaching overhead ( MET 01/15/24 )     - Patient will exhibit functional trunk mobility and strength to allow non-limiting bed mobility activities due to pain.  ( PROGRESSING 01/15/24 )      Long-Term Goals: 8 Weeks      - Patient will demonstrate improved BLE strength of at least 5/5 to aid in functional transfers. ( PROGRESSING 01/19/24 )     - Patient will demonstrate 5 xSTS < 11 seconds ( PROGRESSING 01/15/24 )     - Patient will demonstrate 30 sec STS >15 ( MET 01/15/24 )     - Patient will report sleep not disrupted by back pain to aid in participation of functional ADLs during the day. ( MET 01/15/24 )     - Patient will report abolishment of radicular symptoms with worst subjective pain <=2/10 to allow return to community ambulation not limited by pain. ( PROGRESSING 01/15/24 )      Plan: Continue per POC       Total Session Time 40 and Timed code minutes 40  THERAPEUTIC EXERCISE 40 minutes      Chauncey Kalata, PT  01/20/2024, 10:30

## 2024-01-21 ENCOUNTER — Ambulatory Visit
Admission: RE | Admit: 2024-01-21 | Discharge: 2024-01-21 | Disposition: A | Discharge status: Still In Hospital | Payer: Self-pay | Source: Ambulatory Visit

## 2024-01-21 NOTE — Progress Notes (Addendum)
 [facility] Medicine [facility]  Outpatient Physical Therapy  [address]  [place], [address]  [phone]  (Fax) [phone]    Physical Therapy Treatment Note    Date: 01/21/2024  Patient's Name: George Vincent.  Date of Birth: 10-Feb-1966  Physical Therapy Visit    Visit #/POC: 9 /16  Authorization:   POC Signed?: No  POC Ends: 10/22  Order Ends: 10/20  Next Progress Note Due: Visit 8     Evaluating Physical Therapist: Chauncey Kalata PT, DPT   PT diagnosis/Reason for Referral: ALIF L4-S1   Next Scheduled Physician Appointment: 03/07/24  Allergies/Contraindications: Slow gentle movements only, < 10#      Subjective: Patient reports he is sore secondary to progression of exercise program this date. Patient returns with new order for continued PT.     Objective: Activities per flowsheet       AROM    Right IE  Right 8/21 Left IE  Left 8/21   Flexion 45 70 NT NT   Extension 0 10 NT NT   Sidebend 5 12 5 13    Rotation NT NT NT NT   ROM comments:     Strength (Manual Muscle Testing per [employer] Muscle Grading system)       Right IE  Right 8/21 Left IE  Left 8/21   Hip flexion (L1,2) 4+/5 4+/5 4+/5 5/5   Knee flexion (S1) 5/5 5/5 5/5 5/5   Knee extension (L2-4) 5/5 5/5 5/5 5/5   Ankle DF (L4-5) 5/5 5/5 5/5 5/5   Great toe ext  5/5 5/5 5/5 5/5   Ankle PF (S1,2) 5/5 5/5 5/5 5/5   Strength comments:     Reflexes       IE  8/25   Patellar 2+ B 2+ B   Achilles 2+ B 2+B    Clonus LE  ( - )  ( - )          FUNCTIONAL TESTING:    8/21   5xSTS  11.33 Seconds    30 sec STS  20 Reps         Measured ROM on  (mm/dd/yyyy):             EXERCISE/ACTIVITY NAME REPETITIONS RESISTANCE COMPLETED THIS DOS   HEP Review     N   Nu-step warm up  5  L 4.0  N   PPT  2x10   N   PPT with march 2x10   N   DKTC with ball 30x   N   Small range bridge with ball 2x15   N   Bridge  2x15   Y   LTR with ball 2x10   N   Lumbar Ball roll out FWD  10x10   N   Lumbar ball roll out diagonal 10x10ea   N   Good Posture/Bad posture   10xea   N   Manual SKTC  5x30ea   N   Manual piriformis S  5x30ea   N   Seated PPT  2x10   N   Seated marching   2x10   N   Standing hip ext small range  2x10ea Red N   Standing hip ABD  2x10 Red  N   Seated Anti-rotation with manual perturbations  20xea Black  N   STS with med ball TVA activation  10x  4.4# N   Modified Plank  2x5x5   Y   Supine PPT UE raise with med  ball 15x 4.4# N   Cat/Cow  10x   N   Bird Dog 10xea   N   Open Books 10xea Elbow bent Y   DKTC 2x10   Y   Rep FISIT  20x   Y   Rep EIS 20x   Y   Rep SGIS L and R  10xea   Y           DISCONTINUED ACTIVITIES                                                Assessment: Patient continues to progress per physician protocol. Patient notes soreness following today's session. Objective measures carried over from last weeks progress note. POC extended this date to reflect new order. Patient will continue to benefit from skilled PT to facilitate return to PLOF.      Short-Term Goals: 4 Weeks      - Patient will report no more LE cramping ( PROGRESSING 01/15/24 )     - Patient will be independent with progressive HEP to maximize gains from PT. ( MET 01/15/24 )     - Patient will report max 2 LBP to aid in participation in PT. ( NOT MET 01/15/24 )     - Patient will report tolerance to reaching overhead ( MET 01/15/24 )     - Patient will exhibit functional trunk mobility and strength to allow non-limiting bed mobility activities due to pain.  ( PROGRESSING 01/15/24 )      Long-Term Goals: 8 Weeks      - Patient will demonstrate improved BLE strength of at least 5/5 to aid in functional transfers. ( PROGRESSING 01/19/24 )     - Patient will demonstrate 5 xSTS < 11 seconds ( PROGRESSING 01/15/24 )     - Patient will demonstrate 30 sec STS >15 ( MET 01/15/24 )     - Patient will report sleep not disrupted by back pain to aid in participation of functional ADLs during the day. ( MET 01/15/24 )     - Patient will report abolishment of radicular symptoms with worst subjective  pain <=2/10 to allow return to community ambulation not limited by pain. ( PROGRESSING 01/15/24 )      Plan: Will extend POC 2x per week for 8 additional weeks    Total Session Time 42 and Timed code minutes 42  THERAPEUTIC EXERCISE 42 minutes      Chauncey Kalata, PT  01/21/2024, 11:18

## 2024-01-27 ENCOUNTER — Ambulatory Visit (HOSPITAL_COMMUNITY)
Admission: RE | Admit: 2024-01-27 | Discharge: 2024-01-27 | Disposition: A | Discharge status: Still In Hospital | Payer: Self-pay | Source: Ambulatory Visit

## 2024-01-27 ENCOUNTER — Other Ambulatory Visit: Payer: Self-pay

## 2024-01-27 DIAGNOSIS — M5416 Radiculopathy, lumbar region: Secondary | ICD-10-CM | POA: Insufficient documentation

## 2024-01-27 NOTE — PT Treatment (Signed)
 [facility] Medicine [facility]  Outpatient Physical Therapy  [address]  [place], [address]  [phone]  (Fax) [phone]    Physical Therapy Treatment Note    Date: 01/27/2024  Patient's Name: George Vincent.  Date of Birth: 12/03/1965  Physical Therapy Visit    Visit #/POC: 10 /32  Authorization:   POC Signed?: No  POC Ends: 10/22  Order Ends: 10/20  Next Progress Note Due: Visit 8     Evaluating Physical Therapist: Chauncey Kalata PT, DPT   PT diagnosis/Reason for Referral: ALIF L4-S1   Next Scheduled Physician Appointment: 03/07/24  Allergies/Contraindications: Slow gentle movements only, < 10#      Subjective: Patient reports he is sore/achy in low back prior to today's session. Patient notes good tolerance to progression of exercise program this date.     Objective: Activities per flowsheet        Measured ROM on  (mm/dd/yyyy):             EXERCISE/ACTIVITY NAME REPETITIONS RESISTANCE COMPLETED THIS DOS   HEP Review     N   Nu-step warm up  5  L 4.0  N   PPT  2x10   N   PPT with march 2x10   N   DKTC with ball 30x   N   Small range bridge with ball 2x15   N   Bridge  2x15   N   LTR with ball 2x10   Y   Lumbar Ball roll out FWD  10x10   N   Lumbar ball roll out diagonal 10x10ea   N   Good Posture/Bad posture  10xea   N   Manual SKTC  5x30ea   N   Manual piriformis S  5x30ea   N   Seated PPT  2x10   N   Seated marching   2x10   N   Standing hip ext small range  2x10ea Red N   Standing hip ABD  2x10 Red  N   Seated Anti-rotation with manual perturbations  20xea Black  N   STS with med ball TVA activation  20x #6.6 Y    Modified Plank  2x5x5   Y   Supine PPT UE raise with med ball 15x #6.6 N   Cat/Cow  15x   Y   Bird Dog 10xea   Y   Open Books 15xea Elbow bent Y   REP FIL  2x15   Y   Rep FISIT  20x   Y   REP EIL 2x10  Y   Rep EIS 20x   Y   Rep SGIS L and R  10xea   Y           DISCONTINUED ACTIVITIES                                                Assessment: Patient demos good  tolerance to exercise program this date. Patient denies pain throughout session. Patient notes greatest difficulty with cat/cow and plank exercises.      Short-Term Goals: 4 Weeks      - Patient will report no more LE cramping ( PROGRESSING 01/15/24 )     - Patient will be independent with progressive HEP to maximize gains from PT. ( MET 01/15/24 )     - Patient  will report max 2 LBP to aid in participation in PT. ( NOT MET 01/15/24 )     - Patient will report tolerance to reaching overhead ( MET 01/15/24 )     - Patient will exhibit functional trunk mobility and strength to allow non-limiting bed mobility activities due to pain.  ( PROGRESSING 01/15/24 )      Long-Term Goals: 8 Weeks      - Patient will demonstrate improved BLE strength of at least 5/5 to aid in functional transfers. ( PROGRESSING 01/19/24 )     - Patient will demonstrate 5 xSTS < 11 seconds ( PROGRESSING 01/15/24 )     - Patient will demonstrate 30 sec STS >15 ( MET 01/15/24 )     - Patient will report sleep not disrupted by back pain to aid in participation of functional ADLs during the day. ( MET 01/15/24 )     - Patient will report abolishment of radicular symptoms with worst subjective pain <=2/10 to allow return to community ambulation not limited by pain. ( PROGRESSING 01/15/24 )      Plan: Continue per POC        Total Session Time 40 and Timed code minutes 40  THERAPEUTIC EXERCISE 40 minutes      Chauncey Kalata, PT  01/27/2024, 15:14

## 2024-01-29 ENCOUNTER — Other Ambulatory Visit: Payer: Self-pay

## 2024-01-29 ENCOUNTER — Ambulatory Visit (HOSPITAL_COMMUNITY)
Admission: RE | Admit: 2024-01-29 | Discharge: 2024-01-29 | Disposition: A | Discharge status: Still In Hospital | Payer: Self-pay | Source: Ambulatory Visit

## 2024-01-29 NOTE — PT Treatment (Signed)
 [facility] Medicine [facility]  Outpatient Physical Therapy  [address]  [place], [address]  [phone]  (Fax) [phone]    Physical Therapy Treatment Note    Date: 01/29/2024  Patient's Name: George Vincent.  Date of Birth: Sep 27, 1965  Physical Therapy Visit    Visit #/POC: 11 /32  Authorization:   POC Signed?: No  POC Ends: 10/22  Order Ends: 10/20  Next Progress Note Due: Visit 8     Evaluating Physical Therapist: Chauncey Kalata PT, DPT   PT diagnosis/Reason for Referral: ALIF L4-S1   Next Scheduled Physician Appointment: 03/07/24  Allergies/Contraindications: Slow gentle movements only, < 10#      Subjective: Patient reports he is sore prior to today's session. Patient notes he was cleaning his house and floors prior this morning. Patient notes his back began to tighten up, he notes he laid down and got an electrical jolt type pain. Patient notes this occurred twice.     Objective: Activities per flowsheet        Measured ROM on  (mm/dd/yyyy):             EXERCISE/ACTIVITY NAME REPETITIONS RESISTANCE COMPLETED THIS DOS   HEP Review     N   Nu-step warm up  5  L 4.0  N   PPT  2x10   N   PPT with march 2x10   N   DKTC with ball 30x   N   Small range bridge with ball 2x15   N   Bridge  2x15   N   LTR with ball 2x10   Y   Lumbar Ball roll out FWD  10x10   N   Lumbar ball roll out diagonal 10x10ea   N   Good Posture/Bad posture  10xea   N   Manual SKTC  5x30ea   N   Manual piriformis S  5x30ea   N   Seated PPT  2x10   N   Seated marching   2x10   N   Standing hip ext small range  2x10ea Red N   Standing hip ABD  2x10 Red  N   Seated Anti-rotation with manual perturbations  20xea Black  N   STS with med ball TVA activation  20x #6.6 Y    Modified Plank  2x5x5   Next   Supine PPT UE raise with med ball 15x #6.6 N   Cat/Cow  15x   Next   Bird Dog 10xea   Next   Open Books 15xea Elbow bent Y   REP FIL  2x15   Y   Rep FIL with Rot R/L  10xea  Y   Rep FISIT  20x   Y   REP EIL 2x15   Y    Rep EIS 20x   Y   Rep SGIS L and R  10xea   Y   Lumbar STM    Y           DISCONTINUED ACTIVITIES                                                Assessment: Patient demos fair tolerance to exercise program this date. STM provided this date to reduce muscular tightness/spasm. Patient notes soreness post session.      Short-Term Goals: 4 Weeks      -  Patient will report no more LE cramping ( PROGRESSING 01/15/24 )     - Patient will be independent with progressive HEP to maximize gains from PT. ( MET 01/15/24 )     - Patient will report max 2 LBP to aid in participation in PT. ( NOT MET 01/15/24 )     - Patient will report tolerance to reaching overhead ( MET 01/15/24 )     - Patient will exhibit functional trunk mobility and strength to allow non-limiting bed mobility activities due to pain.  ( PROGRESSING 01/15/24 )      Long-Term Goals: 8 Weeks      - Patient will demonstrate improved BLE strength of at least 5/5 to aid in functional transfers. ( PROGRESSING 01/19/24 )     - Patient will demonstrate 5 xSTS < 11 seconds ( PROGRESSING 01/15/24 )     - Patient will demonstrate 30 sec STS >15 ( MET 01/15/24 )     - Patient will report sleep not disrupted by back pain to aid in participation of functional ADLs during the day. ( MET 01/15/24 )     - Patient will report abolishment of radicular symptoms with worst subjective pain <=2/10 to allow return to community ambulation not limited by pain. ( PROGRESSING 01/15/24 )      Plan: Continue per POC    Total Session Time 40 and Timed code minutes 40  THERAPEUTIC EXERCISE 40 minutes      Chauncey Kalata, PT  01/29/2024, 15:19

## 2024-02-03 ENCOUNTER — Ambulatory Visit (HOSPITAL_COMMUNITY): Payer: Self-pay

## 2024-02-05 ENCOUNTER — Ambulatory Visit (HOSPITAL_COMMUNITY)
Admission: RE | Admit: 2024-02-05 | Discharge: 2024-02-05 | Disposition: A | Discharge status: Still In Hospital | Payer: Self-pay | Source: Ambulatory Visit

## 2024-02-05 ENCOUNTER — Other Ambulatory Visit: Payer: Self-pay

## 2024-02-05 NOTE — PT Treatment (Signed)
 [facility] Medicine [facility]  Outpatient Physical Therapy  [address]  [place], [address]  [phone]  (Fax) [phone]    Physical Therapy Treatment Note    Date: 02/05/2024  Patient's Name: George Vincent.  Date of Birth: Aug 16, 1965  Physical Therapy Visit    Visit #/POC: 12 /32  Authorization:   POC Signed?: No  POC Ends: 10/22  Order Ends: 10/20  Next Progress Note Due: Visit 8     Evaluating Physical Therapist: Chauncey Kalata PT, DPT   PT diagnosis/Reason for Referral: ALIF L4-S1   Next Scheduled Physician Appointment: 03/07/24  Allergies/Contraindications: Slow gentle movements only, < 10#      Subjective: Patient notes minimal pain prior to today's session. Patient denies any electrical jolt pain prior to today's session. Patient continues to note numbness persists. Patient notes he has not had any of cramping/sciatic type pain in the last week.      Objective: Activities per flowsheet        Measured ROM on  (mm/dd/yyyy):             EXERCISE/ACTIVITY NAME REPETITIONS RESISTANCE COMPLETED THIS DOS   HEP Review     N   Nu-step warm up  5  L 4.0  N   PPT  2x10   N   PPT with march 2x10   N   DKTC with ball 30x   N   Small range bridge with ball 2x15   N   Bridge  2x15   N   LTR with ball 2x10   Y   Lumbar Ball roll out FWD  10x10   N   Lumbar ball roll out diagonal 10x10ea   N   Good Posture/Bad posture  10xea   N   Manual SKTC  5x30ea   N   Manual piriformis S  5x30ea   N   Seated PPT  2x10   N   Seated marching   2x10   N   Standing hip ext small range  2x10ea Red N   Standing hip ABD  2x10 Red  N   Seated Anti-rotation with manual perturbations  20xea Black  N   STS with med ball TVA activation  20x #6.6 Y    Modified Plank  2x5x5   Next   Supine PPT UE raise with med ball 15x #6.6 N   Cat/Cow  15x   N   Bird Dog 10xea   Y   Open Books 15xea Elbow bent Next   REP FIL  2x15   Y   Rep FIL with Rot R/L  10xea   Next   Rep FISIT  20x   Y   REP EIL 2x15   Y   Rep EIS 20x   Y    Rep SGIS L and R  10xea   Y   Lumbar ext machine  15x 20# Y   Ab machine  15x 35# Y   Leg Press  20x 100# Y   Lumbar STM      Y           DISCONTINUED ACTIVITIES                                                Assessment: Patient demos good tolerance to progression of program this date. Patient  does note multiple incidence of electrical jolt pain with transitions during session.      Short-Term Goals: 4 Weeks      - Patient will report no more LE cramping ( PROGRESSING 01/15/24 )     - Patient will be independent with progressive HEP to maximize gains from PT. ( MET 01/15/24 )     - Patient will report max 2 LBP to aid in participation in PT. ( NOT MET 01/15/24 )     - Patient will report tolerance to reaching overhead ( MET 01/15/24 )     - Patient will exhibit functional trunk mobility and strength to allow non-limiting bed mobility activities due to pain.  ( PROGRESSING 01/15/24 )      Long-Term Goals: 8 Weeks      - Patient will demonstrate improved BLE strength of at least 5/5 to aid in functional transfers. ( PROGRESSING 01/19/24 )     - Patient will demonstrate 5 xSTS < 11 seconds ( PROGRESSING 01/15/24 )     - Patient will demonstrate 30 sec STS >15 ( MET 01/15/24 )     - Patient will report sleep not disrupted by back pain to aid in participation of functional ADLs during the day. ( MET 01/15/24 )     - Patient will report abolishment of radicular symptoms with worst subjective pain <=2/10 to allow return to community ambulation not limited by pain. ( PROGRESSING 01/15/24 )      Plan: Continue per POC    Total Session Time 40 and Timed code minutes 40  THERAPEUTIC EXERCISE 40 minutes      Chauncey Kalata, PT  02/05/2024, 15:05

## 2024-02-10 ENCOUNTER — Ambulatory Visit (HOSPITAL_COMMUNITY)
Admission: RE | Admit: 2024-02-10 | Discharge: 2024-02-10 | Disposition: A | Discharge status: Still In Hospital | Payer: Self-pay | Source: Ambulatory Visit

## 2024-02-10 ENCOUNTER — Other Ambulatory Visit: Payer: Self-pay

## 2024-02-10 NOTE — PT Treatment (Signed)
 [facility] Medicine [facility]  Outpatient Physical Therapy  [address]  [place], [address]  [phone]  (Fax) [phone]    Physical Therapy Treatment Note    Date: 02/10/2024  Patient's Name: George Vincent.  Date of Birth: [birth year]  Physical Therapy Visit    Visit #/POC: 13 /32  Authorization:   POC Signed?: No  POC Ends: 10/22  Order Ends: 10/20  Next Progress Note Due: Visit 8     Evaluating Physical Therapist: Chauncey Kalata PT, DPT   PT diagnosis/Reason for Referral: ALIF L4-S1   Next Scheduled Physician Appointment: 03/07/24  Allergies/Contraindications: Slow gentle movements only, < 10#      Subjective: Patient reports he is prior to today's session. Patient notes he was stacking fire wood on Friday and some on Saturday morning. Patient notes soreness began Saturday morning. Patient denies increased soreness following progression of exercise program last session. Patient reports he has not been able to start back to work yet, he is still awaiting Dr. To return form for clearance.      Objective: Activities per flowsheet        Measured ROM on  (mm/dd/yyyy):             EXERCISE/ACTIVITY NAME REPETITIONS RESISTANCE COMPLETED THIS DOS   HEP Review     N   Nu-step warm up  5  L 4.0  N   PPT  2x10   N   PPT with march 2x10   N   DKTC with ball 30x   N   Small range bridge with ball 2x15   N   Bridge  2x15   N   LTR with ball 15x   Y   Lumbar Ball roll out FWD  10x10   N   Lumbar ball roll out diagonal 10x10ea   N   Good Posture/Bad posture  10xea   N   Manual SKTC  5x30ea   N   Manual piriformis S  5x30ea   N   Seated PPT  2x10   N   Seated marching   2x10   N   Standing hip ext small range  2x10ea Red N   Standing hip ABD  2x10 Red  N   Seated Anti-rotation with manual perturbations  20xea Black  N   STS with med ball TVA activation  20x #6.6 N   Modified Plank  2x5x5   Next   Supine PPT UE raise with med ball 15x #6.6 N   Cat/Cow  15x   N   Bird Dog 10xea   Y   Open Books  15xea Elbow bent Y   REP FIL  2x15   Y   Rep FIL with Rot R/L  10xea   N   Rep FISIT  20x   Y   REP EIL x15   Y   Rep EIS 20x   Y   Rep SGIS L and R  15xea   Y   Lumbar ext machine  2x12 50# Y   Ab machine  2x10 55# Y   Leg Press  2x10 130# Y   Lumbar STM      N           DISCONTINUED ACTIVITIES  Assessment: Patient continues to demo good tolerance to progression of exercise program and resistance. Patient does note jolt pain with LTR again this date, that occurs when turning left. Patient notes 1/10 tailbone pain post session.      Short-Term Goals: 4 Weeks      - Patient will report no more LE cramping ( PROGRESSING 01/15/24 )     - Patient will be independent with progressive HEP to maximize gains from PT. ( MET 01/15/24 )     - Patient will report max 2 LBP to aid in participation in PT. ( NOT MET 01/15/24 )     - Patient will report tolerance to reaching overhead ( MET 01/15/24 )     - Patient will exhibit functional trunk mobility and strength to allow non-limiting bed mobility activities due to pain.  ( PROGRESSING 01/15/24 )      Long-Term Goals: 8 Weeks      - Patient will demonstrate improved BLE strength of at least 5/5 to aid in functional transfers. ( PROGRESSING 01/19/24 )     - Patient will demonstrate 5 xSTS < 11 seconds ( PROGRESSING 01/15/24 )     - Patient will demonstrate 30 sec STS >15 ( MET 01/15/24 )     - Patient will report sleep not disrupted by back pain to aid in participation of functional ADLs during the day. ( MET 01/15/24 )     - Patient will report abolishment of radicular symptoms with worst subjective pain <=2/10 to allow return to community ambulation not limited by pain. ( PROGRESSING 01/15/24 )      Plan: Continue per POC    Total Session Time 45 and Timed code minutes 45  THERAPEUTIC EXERCISE 45 minutes      Chauncey Kalata, PT  02/10/2024, 15:16

## 2024-02-17 ENCOUNTER — Other Ambulatory Visit: Payer: Self-pay

## 2024-02-17 ENCOUNTER — Ambulatory Visit (HOSPITAL_COMMUNITY)
Admission: RE | Admit: 2024-02-17 | Discharge: 2024-02-17 | Disposition: A | Discharge status: Still In Hospital | Payer: Self-pay | Source: Ambulatory Visit

## 2024-02-17 NOTE — PT Treatment (Signed)
 [facility] Medicine [facility]  Outpatient Physical Therapy  [address]  [place], [address]  [phone]  (Fax) [phone]    Physical Therapy Treatment Note    Date: 02/17/2024  Patient's Name: George Vincent.  Date of Birth: 09-13-1965  Physical Therapy Visit    Visit #/POC: 14 /32  Authorization:   POC Signed?: No  POC Ends: 10/22  Order Ends: 10/20  Next Progress Note Due: Visit 15     Evaluating Physical Therapist: Chauncey Kalata PT, DPT   PT diagnosis/Reason for Referral: ALIF L4-S1   Next Scheduled Physician Appointment: 03/07/24  Allergies/Contraindications: Slow gentle movements only, < 10#      Subjective: Patient reports consistent bilateral lumbar soreness/tightness and intermittent tailbone pain that occurs with bouncing or bumping his butt. Patient reports tailbone pain is sharp. Patient also notes two spots where he is experiencing zingers when laying on his back. Patient reports there is one spot in the thoracic spine and a new spot just above the consistent ache above lumbar. Patient reports prior to today's session his sore and stiff. Patient reports he has not been as active over the last two days to see if this reduced pain. He states pain did not change but notes his back became stiff.      Objective: Activities per flowsheet        Measured ROM on  (mm/dd/yyyy):             EXERCISE/ACTIVITY NAME REPETITIONS RESISTANCE COMPLETED THIS DOS   HEP Review     N   Nu-step warm up  5  L 4.0  N   PPT  2x10   N   PPT with march 2x10   N   DKTC with ball 30x   N   Small range bridge with ball 2x15   N   Bridge  2x15   N   LTR with ball 15x   N   Lumbar Ball roll out FWD  10x10   N   Lumbar ball roll out diagonal 10x10ea   N   Good Posture/Bad posture  10xea   N   Manual SKTC  5x30ea   N   Manual piriformis S  5x30ea   N   Seated PPT  2x10   N   Seated marching   2x10   N   Standing hip ext small range  2x10ea Red N   Standing hip ABD  2x10 Red  N   Seated  Anti-rotation with manual perturbations  20xea Black  N   STS with med ball TVA activation  20x #6.6 N   Modified Plank  2x5x5   DNT   Supine PPT UE raise with med ball 15x #6.6 N   Cat/Cow  15x   N   Bird Dog 10xea   DNT   Open Books 15xea Elbow bent Y   REP FIL  15x   Y   Rep FIL with Rot R/L  10xea   N   Rep FISIT  20x   Y   REP EIL x15   Y   Rep EIS 20x   N   Rep SGIS L and R  15xea   Y   Lumbar ext machine  2x12 50# Y   Ab machine  2x10 55# Y   Leg Press  2x10 130# N   Box Lift  10x 10# Y   SL RDL  10x 10# Y   Lumbar STM  N           DISCONTINUED ACTIVITIES                                                Assessment: Patient continues to demo fair tolerance to exercise program. Patient continues to note lumbar spasm/ tension with position change. Patient unable to perform modified plank and bird dog exercises this date. Patient demo increased lumbar tightness following program.      Short-Term Goals: 4 Weeks      - Patient will report no more LE cramping ( PROGRESSING 01/15/24 )     - Patient will be independent with progressive HEP to maximize gains from PT. ( MET 01/15/24 )     - Patient will report max 2 LBP to aid in participation in PT. ( NOT MET 01/15/24 )     - Patient will report tolerance to reaching overhead ( MET 01/15/24 )     - Patient will exhibit functional trunk mobility and strength to allow non-limiting bed mobility activities due to pain.  ( PROGRESSING 01/15/24 )      Long-Term Goals: 8 Weeks      - Patient will demonstrate improved BLE strength of at least 5/5 to aid in functional transfers. ( PROGRESSING 01/19/24 )     - Patient will demonstrate 5 xSTS < 11 seconds ( PROGRESSING 01/15/24 )     - Patient will demonstrate 30 sec STS >15 ( MET 01/15/24 )     - Patient will report sleep not disrupted by back pain to aid in participation of functional ADLs during the day. ( MET 01/15/24 )     - Patient will report abolishment of radicular symptoms with worst subjective pain <=2/10 to allow return to  community ambulation not limited by pain. ( PROGRESSING 01/15/24 )      Plan: Reassess next visit    Total Session Time 45 and Timed code minutes 45  THERAPEUTIC EXERCISE 45 minutes      Chauncey Kalata, PT  02/17/2024, 15:07

## 2024-02-19 ENCOUNTER — Other Ambulatory Visit: Payer: Self-pay

## 2024-02-19 ENCOUNTER — Ambulatory Visit
Admission: RE | Admit: 2024-02-19 | Discharge: 2024-02-19 | Disposition: A | Discharge status: Still In Hospital | Payer: Self-pay | Source: Ambulatory Visit

## 2024-02-19 NOTE — Progress Notes (Signed)
 [facility] Medicine [facility]  Outpatient Physical Therapy  [address]  [place], [address]  [phone]  (Fax) [phone]    Physical Therapy Progress Note    Date: 02/19/2024  Patient's Name: George Vincent.  Date of Birth: May 21, 1966  Physical Therapy Progress Note     Visit #/POC: 15 /32  Authorization:   POC Signed?: No  POC Ends: 10/22  Order Ends: 10/20  Next Progress Note Due: Visit 25     Evaluating Physical Therapist: Chauncey Kalata PT, DPT   PT diagnosis/Reason for Referral: ALIF L4-S1   Next Scheduled Physician Appointment: 03/08/24  Allergies/Contraindications: Slow gentle movements only, < 10#      Subjective: Patient reports pain is about the same today. Patient reports he feels progress has plateaued since week 10. Patient reports first 20 steps after prolonged sitting is difficult. Patient continues to note increased pain with vibrating with driving etc. Patient notes soreness remains unchanged. Patient also notes continues thoracic pain which causes poor tolerance to supine positioning and pressure on his back. Patient continues to note tailbone pain. Patient continues to note numbness in left middle two toes, patients notes stiff ankle 50% of the time. Patient radiation from lateral calf to arch of the foot when ankle is stiff. Patient reports intermittent numbness in right foot and constant numbness in right lateral thigh. Patient reports at times he experiences a burning in his right lateral thigh.     Objective: Activities per flowsheet       AROM    Right IE  Right 8/21 Right 9/25 Left IE  Left 8/21 Left 9/25   Flexion 45 70 75 NT NT NT   Extension 0 10 15 NT NT NT   Sidebend 5 12 18 5 13 12    Rotation NT NT NT NT NT NT   ROM comments:     Strength (Manual Muscle Testing per [employer] Muscle Grading system)       Right IE  Right 8/21 Right 9/25 Left IE  Left 8/21 Left 9/25   Hip flexion (L1,2) 4+/5 4+/5 5/5 4+/5 5/5 5/5   Knee flexion (S1) 5/5 5/5 5/5 5/5 5/5 5/5    Knee extension (L2-4) 5/5 5/5 5/5 5/5 5/5 5/5   Ankle DF (L4-5) 5/5 5/5 5/5 5/5 5/5 5/5   Great toe ext  5/5 5/5 5/5 5/5 5/5 5/5   Ankle PF (S1,2) 5/5 5/5 5/5 5/5 5/5 5/5   Strength comments:     Reflexes       IE  8/25 9/25   Patellar 2+ B 2+ B 2+ B    Achilles 2+ B 2+B  2+ B    Clonus LE  ( - )  ( - )   ( - )          FUNCTIONAL TESTING:    8/21 9/25   5xSTS  11.33 Seconds  7.16 Seconds   30 sec STS  20 Reps 24 Reps           Measured ROM on  (mm/dd/yyyy):             EXERCISE/ACTIVITY NAME REPETITIONS RESISTANCE COMPLETED THIS DOS   HEP Review     N   Nu-step warm up  5  L 4.0  N   PPT  2x10   N   PPT with march 2x10   N   DKTC with ball 30x   N   Small range bridge with ball  2x15   N   Bridge  2x15   N   LTR with ball 15x   N   Lumbar Ball roll out FWD  10x10   N   Lumbar ball roll out diagonal 10x10ea   N   Good Posture/Bad posture  10xea   N   Manual SKTC  5x30ea   N   Manual piriformis S  5x30ea   N   Seated PPT  2x10   N   Seated marching   2x10   N   Standing hip ext small range  2x10ea Red N   Standing hip ABD  2x10 Red  N   Seated Anti-rotation with manual perturbations  20xea Black  N   STS with med ball TVA activation  20x #6.6 N   Modified Plank  2x5x5   DNT   Supine PPT UE raise with med ball 15x #6.6 N   Cat/Cow  15x   N   Bird Dog 10xea   DNT   Open Books 15xea Elbow bent Next   REP FIL  15x   N   Rep FIL with Rot R/L  10xea   N   Rep FISIT  20x   N   REP EIL x15   Next   Rep EIS 20x   N   Rep SGIS L and R  15xea   N   Lumbar ext machine  2x12 50# N   Ab machine  2x10 55# N   Leg Press  2x10 130# N   Box Lift  10x 10# N   SL RDL  10x 10# N   Lumbar STM      Y   Grade I-II Lumbar/thoracic P-A glides    Y   US  , 1.2    Y           DISCONTINUED ACTIVITIES                                                Assessment: Patient demos fair tolerance to today's session. Patient continues to note pain with transitions to/from table. During prone P-A glides patient's pop was recreated with general  pressure, was felt in the region of T12-L2. Patient notes this is the pop he experiences when laying down and with movement at times. Patient notes sharp take your breath away pain and some sensations of relief of muscle tension after. US  performed to reduce scar tissue.      Short-Term Goals: 4 Weeks      - Patient will report no more LE cramping ( MET 02/19/24  )     - Patient will be independent with progressive HEP to maximize gains from PT. ( MET 01/15/24 )     - Patient will report max 2 LBP to aid in participation in PT. ( NOT MET 02/19/24 )     - Patient will report tolerance to reaching overhead ( MET 01/15/24 )     - Patient will exhibit functional trunk mobility and strength to allow non-limiting bed mobility activities due to pain.  ( NOT MET 02/19/24  )      Long-Term Goals: 8 Weeks      - Patient will demonstrate improved BLE strength of at least 5/5 to aid in functional transfers. ( MET 02/19/24 )     - Patient  will demonstrate 5 xSTS < 11 seconds ( MET 02/19/24  )     - Patient will demonstrate 30 sec STS >15 ( MET 01/15/24 )     - Patient will report sleep not disrupted by back pain to aid in participation of functional ADLs during the day. ( MET 01/15/24 )     - Patient will report abolishment of radicular symptoms with worst subjective pain <=2/10 to allow return to community ambulation not limited by pain. ( PROGRESSING 02/19/24 )      Plan: Continue per POC     Total Session Time 45 and Timed code minutes 45   ULTRASOUND and JOINT MOBILIZATION/MFR 29 minutes      Chauncey Kalata, PT  02/19/2024, 15:15

## 2024-02-26 ENCOUNTER — Ambulatory Visit
Admission: RE | Admit: 2024-02-26 | Discharge: 2024-02-26 | Disposition: A | Discharge status: Still In Hospital | Payer: Self-pay | Source: Ambulatory Visit

## 2024-02-26 ENCOUNTER — Other Ambulatory Visit: Payer: Self-pay

## 2024-02-26 DIAGNOSIS — M5416 Radiculopathy, lumbar region: Secondary | ICD-10-CM | POA: Insufficient documentation

## 2024-02-26 DIAGNOSIS — M545 Low back pain, unspecified: Secondary | ICD-10-CM | POA: Insufficient documentation

## 2024-02-26 NOTE — PT Treatment (Signed)
 [facility] Medicine [facility]  Outpatient Physical Therapy  [address]  [place], [address]  [phone]  (Fax) [phone]    Physical Therapy Treatment Note    Date: 02/26/2024  Patient's Name: George Vincent.  Date of Birth: 1966/01/07  Physical Therapy Visit    Visit #/POC: 16 /32  Authorization:   POC Signed?: No  POC Ends: 10/22  Order Ends: 10/20  Next Progress Note Due: Visit 25     Evaluating Physical Therapist: Chauncey Kalata PT, DPT   PT diagnosis/Reason for Referral: ALIF L4-S1   Next Scheduled Physician Appointment: 03/08/24  Allergies/Contraindications: Slow gentle movements only, < 10#      Subjective: Patient reports he will return to work tomorrow on 02/26/24. Patient continues to note popping and shooting pain with laying supine.      Objective: Activities per flowsheet         Measured ROM on  (mm/dd/yyyy):             EXERCISE/ACTIVITY NAME REPETITIONS RESISTANCE COMPLETED THIS DOS   HEP Review     N   Nu-step warm up  5  L 4.0  N   PPT  2x10   N   PPT with march 2x10   N   DKTC with ball 30x   N   Small range bridge with ball 2x15   N   Bridge  2x15   N   LTR with ball 15x   N   Lumbar Ball roll out FWD  10x10   N   Lumbar ball roll out diagonal 10x10ea   N   Good Posture/Bad posture  10xea   N   Manual SKTC  5x30ea   N   Manual piriformis S  5x30ea   N   Seated PPT  2x10   N   Seated marching   2x10   N   Standing hip ext small range  2x10ea Red N   Standing hip ABD  2x10 Red  N   Seated Anti-rotation with manual perturbations  20xea Black  N   STS with med ball TVA activation  20x #6.6 N   Modified Plank  2x5x5   DNT   Supine PPT UE raise with med ball 15x #6.6 N   Cat/Cow  15x   N   Bird Dog 10xea   DNT   Open Books 15xea Elbow bent Next   REP FIL  15x   N   Rep FIL with Rot R/L  10xea   N   Rep FISIT  20x   N   REP EIL x15   Next   Rep EIS 20x   N   Rep SGIS L and R  15xea   N   Lumbar ext machine  2x12 50# N   Ab machine  2x10 55# N   Leg Press  2x10 130# N    Box Lift  10x 10# N   SL RDL  10x 10# N   Lumbar STM      Y   Grade I-II Lumbar/thoracic P-A glides      Y   US  , 1.2      Y   Prone hip ext 2x10ea  Y   Prone ALT UE/LE lifts 2x10ea With OP Y   Prone superman 2x5 With OP  Y           DISCONTINUED ACTIVITIES  Assessment: Patient demos improved tolerance to exercise program this date. Patient demos reduced pain/difficulty with manual lumbar OP during prone strengthening exercises. Patient continues to note positive response US  over lumbar incisions.      Short-Term Goals: 4 Weeks      - Patient will report no more LE cramping ( MET 02/19/24  )     - Patient will be independent with progressive HEP to maximize gains from PT. ( MET 01/15/24 )     - Patient will report max 2 LBP to aid in participation in PT. ( NOT MET 02/19/24 )     - Patient will report tolerance to reaching overhead ( MET 01/15/24 )     - Patient will exhibit functional trunk mobility and strength to allow non-limiting bed mobility activities due to pain.  ( NOT MET 02/19/24  )      Long-Term Goals: 8 Weeks      - Patient will demonstrate improved BLE strength of at least 5/5 to aid in functional transfers. ( MET 02/19/24 )     - Patient will demonstrate 5 xSTS < 11 seconds ( MET 02/19/24  )     - Patient will demonstrate 30 sec STS >15 ( MET 01/15/24 )     - Patient will report sleep not disrupted by back pain to aid in participation of functional ADLs during the day. ( MET 01/15/24 )     - Patient will report abolishment of radicular symptoms with worst subjective pain <=2/10 to allow return to community ambulation not limited by pain. ( PROGRESSING 02/19/24 )      Plan: Continue per POC     Total Session Time 51 and Timed code minutes 51  THERAPEUTIC EXERCISE 15 minutes,  ULTRASOUND , and JOINT MOBILIZATION/MFR 20 minutes      Chauncey Kalata, PT  02/26/2024, 15:19

## 2024-03-02 ENCOUNTER — Other Ambulatory Visit: Payer: Self-pay

## 2024-03-02 ENCOUNTER — Ambulatory Visit
Admission: RE | Admit: 2024-03-02 | Discharge: 2024-03-02 | Disposition: A | Discharge status: Still In Hospital | Payer: Self-pay | Source: Ambulatory Visit

## 2024-03-02 NOTE — PT Treatment (Signed)
 [facility] Medicine [facility]  Outpatient Physical Therapy  [address]  [place], [address]  [phone]  (Fax) [phone]    Physical Therapy Treatment Note    Date: 03/02/2024  Patient's Name: George Vincent.  Date of Birth: 07-08-1965  Physical Therapy Visit    Visit #/POC: 17 /32  Authorization:   POC Signed?: No  POC Ends: 10/22  Order Ends: 10/20  Next Progress Note Due: Visit 25     Evaluating Physical Therapist: Chauncey Kalata PT, DPT   PT diagnosis/Reason for Referral: ALIF L4-S1   Next Scheduled Physician Appointment: 03/08/24  Allergies/Contraindications: Slow gentle movements only, < 10#      Subjective: Patient reports falling on Saturday. Patient reports falling 6 feet onto a slope. Patient reports feet then slipped out from under him. Patient reports increased back pain above belt line. Patient reports pain is steady 8/10. Patient reports radicular symptoms have not worsened since fall. Patient reports no increased symptoms following Thursday's session.     Objective: Activities per flowsheet         Measured ROM on  (mm/dd/yyyy):             EXERCISE/ACTIVITY NAME REPETITIONS RESISTANCE COMPLETED THIS DOS   HEP Review     N   Nu-step warm up  5  L 4.0  N   PPT  2x10   N   PPT with march 2x10   N   DKTC with ball 30x   N   Small range bridge with ball 2x15   N   Bridge  2x15   N   LTR with ball 15x   N   Lumbar Ball roll out FWD  10x10   N   Lumbar ball roll out diagonal 10x10ea   N   Good Posture/Bad posture  10xea   N   Manual SKTC  5x30ea   N   Manual piriformis S  5x30ea   N   Seated PPT  2x10   N   Seated marching   2x10   N   Standing hip ext small range  2x10ea Red N   Standing hip ABD  2x10 Red  N   Seated Anti-rotation with manual perturbations  20xea Black  N   STS with med ball TVA activation  20x #6.6 N   Modified Plank  2x5x5   DNT   Supine PPT UE raise with med ball 15x #6.6 N   Cat/Cow  15x   N   Bird Dog 10xea   DNT   Open Books 15xea Elbow bent Next    REP FIL  15x   N   Rep FIL with Rot R/L  10xea   N   Rep FISIT  20x   N   REP EIL x15   Next   Rep EIS 20x   N   Rep SGIS L and R  15xea   N   Lumbar ext machine  2x12 50# N   Ab machine  2x10 55# N   Leg Press  2x10 130# N   Box Lift  10x 10# N   SL RDL  10x 10# N   Lumbar STM      Y   Grade I-II Lumbar/thoracic P-A glides      N   US  , 1.2      N   Prone hip ext 2x10ea   N   Prone ALT UE/LE lifts 2x10ea With OP  N   Prone superman 2x5 With OP  N           DISCONTINUED ACTIVITIES                                                Assessment: Moist heat and STM performed this session for symptom modulation. Mild swelling noted in left lumbar region. Patient instructed to monitor for worsening symptoms.      Short-Term Goals: 4 Weeks      - Patient will report no more LE cramping ( MET 02/19/24  )     - Patient will be independent with progressive HEP to maximize gains from PT. ( MET 01/15/24 )     - Patient will report max 2 LBP to aid in participation in PT. ( NOT MET 02/19/24 )     - Patient will report tolerance to reaching overhead ( MET 01/15/24 )     - Patient will exhibit functional trunk mobility and strength to allow non-limiting bed mobility activities due to pain.  ( NOT MET 02/19/24  )      Long-Term Goals: 8 Weeks      - Patient will demonstrate improved BLE strength of at least 5/5 to aid in functional transfers. ( MET 02/19/24 )     - Patient will demonstrate 5 xSTS < 11 seconds ( MET 02/19/24  )     - Patient will demonstrate 30 sec STS >15 ( MET 01/15/24 )     - Patient will report sleep not disrupted by back pain to aid in participation of functional ADLs during the day. ( MET 01/15/24 )     - Patient will report abolishment of radicular symptoms with worst subjective pain <=2/10 to allow return to community ambulation not limited by pain. ( PROGRESSING 02/19/24 )      Plan:Monitor response        Total Session Time 40 and Timed code minutes 30  JOINT MOBILIZATION/MFR 30 minutes   MH 10    Chauncey Kalata, PT   03/02/2024, 15:14

## 2024-03-04 ENCOUNTER — Ambulatory Visit (HOSPITAL_COMMUNITY)
Admission: RE | Admit: 2024-03-04 | Discharge: 2024-03-04 | Disposition: A | Discharge status: Still In Hospital | Payer: Self-pay | Source: Ambulatory Visit

## 2024-03-04 ENCOUNTER — Other Ambulatory Visit: Payer: Self-pay

## 2024-03-04 NOTE — PT Treatment (Signed)
 [facility] Medicine [facility]  Outpatient Physical Therapy  [address]  [place], [address]  [phone]  (Fax) [phone]    Physical Therapy Treatment Note    Date: 03/04/2024  Patient's Name: George Vincent.  Date of Birth: 1966-02-04  Physical Therapy Visit    Visit #/POC: 18 /32  Authorization:   POC Signed?: No  POC Ends: 10/22  Order Ends: 10/20  Next Progress Note Due: Visit 25     Evaluating Physical Therapist: Chauncey Kalata PT, DPT   PT diagnosis/Reason for Referral: ALIF L4-S1   Next Scheduled Physician Appointment: 03/08/24  Allergies/Contraindications: Slow gentle movements only, < 10#      Subjective: Patient reports symptoms are status quo last session. Patient reports pain as a 6-7/10 prior to today's session. Patient continues to note movement is exacerbating. Patient notes no reduction in pain following last treatment session. Patient reports Tuesday night he slept in his bed and notes poor tolerance to sleeping on either side. Patient reports he was waking every 30-45 minutes. Patient reports last night he slept in the recliner with fair tolerance but notes increased pain following waking up.    Objective: Activities per flowsheet         Measured ROM on  (mm/dd/yyyy):             EXERCISE/ACTIVITY NAME REPETITIONS RESISTANCE COMPLETED THIS DOS   HEP Review     N   Nu-step warm up  5  L 4.0  N   PPT  2x10   N   PPT with march 2x10   N   DKTC with ball 30x   N   Small range bridge with ball 2x15   N   Bridge  2x15   N   LTR with ball 15x   N   Lumbar Ball roll out FWD  10x10   N   Lumbar ball roll out diagonal 10x10ea   N   Good Posture/Bad posture  10xea   N   Manual SKTC  5x30ea   N   Manual piriformis S  5x30ea   N   Seated PPT  2x10   N   Seated marching   2x10   N   Standing hip ext small range  2x10ea Red N   Standing hip ABD  2x10 Red  N   Seated Anti-rotation with manual perturbations  20xea Black  N   STS with med ball TVA activation  20x #6.6 N    Modified Plank  2x5x5   DNT   Supine PPT UE raise with med ball 15x #6.6 N   Cat/Cow  15x   N   Bird Dog 10xea   DNT   Open Books 15xea Elbow bent Next   REP FIL  15x   N   Rep FIL with Rot R/L  10xea   N   Rep FISIT  20x   N   REP EIL x15   Next   Rep EIS 20x   N   Rep SGIS L and R  15xea   N   Lumbar ext machine  2x12 50# N   Ab machine  2x10 55# N   Leg Press  2x10 130# N   Box Lift  10x 10# N   SL RDL  10x 10# N   Lumbar STM      Y   Grade I-II Lumbar/thoracic P-A glides      N   US   , 1.2      N   Prone hip ext 2x10ea   N   Prone ALT UE/LE lifts 2x10ea With OP N   Prone superman 2x5 With OP  N   E-stim Premod with MH  12 minutes   Y           DISCONTINUED ACTIVITIES                                                Assessment: Treatment again focused on symptom modulation secondary to acute flare up of symptoms. Patient denies relief post session. Patient to see surgeon on Monday.      Short-Term Goals: 4 Weeks      - Patient will report no more LE cramping ( MET 02/19/24  )     - Patient will be independent with progressive HEP to maximize gains from PT. ( MET 01/15/24 )     - Patient will report max 2 LBP to aid in participation in PT. ( NOT MET 02/19/24 )     - Patient will report tolerance to reaching overhead ( MET 01/15/24 )     - Patient will exhibit functional trunk mobility and strength to allow non-limiting bed mobility activities due to pain.  ( NOT MET 02/19/24  )      Long-Term Goals: 8 Weeks      - Patient will demonstrate improved BLE strength of at least 5/5 to aid in functional transfers. ( MET 02/19/24 )     - Patient will demonstrate 5 xSTS < 11 seconds ( MET 02/19/24  )     - Patient will demonstrate 30 sec STS >15 ( MET 01/15/24 )     - Patient will report sleep not disrupted by back pain to aid in participation of functional ADLs during the day. ( MET 01/15/24 )     - Patient will report abolishment of radicular symptoms with worst subjective pain <=2/10 to allow return to community ambulation  not limited by pain. ( PROGRESSING 02/19/24 )      Plan:Monitor response     Total Session Time 30, Timed code minutes 18, and Untimed code minutes 12  JOINT MOBILIZATION/MFR 18 minutes and ELECTRICAL STIMULATION 12 minutes      Chauncey Kalata, PT  03/04/2024, 15:19

## 2024-03-08 NOTE — Progress Notes (Signed)
 CARE TEAM:  Patient Care Team:  Pcp, No as PCP - General (General Practice)    ASSESSMENT  1. Lumbar pain         Patient Active Problem List   Diagnosis   . Hypertensive disorder   . Pain in thoracic spine   . Thoracic radiculopathy   . S/P lumbar fusion         PLAN  Patient seen today for above diagnoses. Discussed diagnosis and treatment options with the patient. Agree upon plan with patient listed below.    Date: 12/15/2023    I removed his staples and applied Steri-Strips to the incision on his back on the right side.  For the incision on the left side of his low back that is draining I cleaned it with Betadine and applied a pressure dressing.  Sent in Keflex to patient's pharmacy  Continue BLT restrictions for 4 more weeks  DC LSO  Follow-up with Dr. Helon as scheduled.      Updated Evaluation and treatment plan     Date: 01/19/24    Work: [name] starting 02/09/24  Updated Subjective: The patient returns, ~7 weeks post-op, noting the near total relief of all his pre-op symptoms and reports minimal back pain and significantly improved radicular symptoms. His pain is no longer radiating down his leg. Continues to complain of infrequent sporadic muscle spasms throughout his LLE, that worsens with the increased stiffness associated with prolonged periods of sitting. The patient has been very active, walking 2 miles per day. Overall they are happy with their progress.  Updated physical: Well healing, no drainage, no erythema, Sensation intact to light touch, 5/5 strength throughout extremities, TTP transversely across thoracolumbar spine  I ordered and independently reviewed and interpreted radiographs of the lumbar spine which show well forming fusion mass, hardware well positioned.    Patient seen today for above diagnoses. Discussed diagnosis and treatment options with the patient. Agree upon plan with patient listed below.    The patient returns, ~7 weeks post-op, noting the near total relief of all his  pre-op symptoms and reports minimal back pain and significantly improved radicular symptoms.  Physical therapy in order to strengthen their thoracic spine and P.O. lumbar spine, provide increased range of motion, and mitigate their current symptoms.  Work note stating [employer] starting 02/09/24.  Continue physical therapy exercises at home.  Ease into bending, lifting, and twisting.  Follow up 8 weeks with [name].       Updated Evaluation and treatment plan     Date: 03/08/2024    Work: No restrictions   Updated Subjective: The patient returns 3 months status post day 1: L4-S1 ALIF followed by day 2: L4-S1 posterior percutaneous lumbar fusion performed on 12/02/2023 and 12/03/2023, respectively, noting worsening mid and lower back pain. He also endorses sporadic right thigh numbness and burning. The patient notes near-total relief of his pre-op symptoms. The patient states he feels a pop sensation in his mid spine that extends transversely across his low back nearly every time he lays down on a hard surface. His pain is described as sharp and can be so bad he feels it takes his breath away. He feels as though this low back pain is getting worse. The patient states he had a fall in the woods last week and now feels as though his symptoms are progressively worsening. The patient has been going to PT. He feels as though PT was helping until he began bending and twisting  in therapy and states after ~10 weeks he feels the PT stopped providing much improvement of his low back symptoms after extensive bending, lifting, and twisting in PT. Extension and bending worsen the patient's symptoms. The patient has been very active, walking around 2 miles every day. Denies bowel/bladder incontinence and/or retention.   Updated physical: Sensation intact to light touch, 5/5 strength throughout extremities, TTP transversely across thoracolumbar spine  I ordered and independently reviewed and interpreted radiographs of the lumbar spine  which show L4-S1 ALIF and L4-S1 posterior percutaneous lumbar well forming fusion mass, hardware well positioned. 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     Patient seen today for above diagnoses. Discussed diagnosis and treatment options with the patient. Agree upon plan with patient listed below.    The patient returns 3 months status post day 1: L4-S1 ALIF followed by day 2: L4-S1 posterior percutaneous lumbar fusion performed on 12/02/2023 and 12/03/2023, respectively, noting worsening mid and lower back pain with near-total improvement of his pre-op symptoms.    Discussed potential operative and non operative treatments   CT of lumbar spine ordered. I need more advanced imaging to rule out micro loosening of the hardware as the cause of the patient's symptoms.  Prescribed Mr. Williamsen medrol dosepak steroids for inflammation.   Physical therapy in order to strengthen their lumbar spine, provide increased range of motion, and mitigate their current symptoms.   Follow up with me after CT scan      Orders Placed This Encounter   . X-ray lumbar spine complete 4+ views ([id number])   . BMI Patient Education      HISTORY OF PRESENT ILLNESS  Chief Complaint: Post-op of the Lower Back   Age: 58 y.o.  Sex: male     History of present illness: Mr. Choi presents today status post day 1: L4-S1 ALIF followed by day 2: L4-S1 posterior percutaneous lumbar fusion performed on 12/02/2023 and 12/03/2023, respectively.  Patient is reporting improvement of preoperative symptoms.  He notes that at this time he has low-level cramping left buttock pain going down to his calf.  He states that the numbness was improved about 75% in his left foot.  He also states that last night he was having lots of pain which was keeping him up.  He notes that he has not been taking any of the pain meds and was previously taking Tylenol  but he is no longer even doing that.  He notes that he was doing very well until about yesterday when he started to notice a lot  of drainage coming from his incision.  He denies any f/c/n/v.     Pain rating: 2  out of 10.   OBJECTIVE    Musculoskeletal   Lumbar exam  Incision: Both pinhole incision and the incision on the right side of his low back are healing well without erythema or drainage.  The incision on the left side is this is without erythema but there was a significant amount of serosanguineous drainage coming from this incision.  Sensation: Sensation intact to light touch throughout lower extremities   Motor: Full motor throughout lower extremities       IMAGING / STUDIES   Order: XR SPINE LUMBAR COMPLETE 4+ VW - Indication: Lumbar pain      X-ray lumbar spine complete 4+ views ([id number])  Result Date: 03/08/2024  AP, Lat, Flexion, Extension.     Impression: I ordered and independently reviewed and interpreted radiographs of the lumbar spine which show L4-S1  ALIF and L4-S1 posterior percutaneous lumbar well forming fusion mass, hardware well positioned. 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         LABS      PROCEDURES  Procedures    Lonni Hurst  Supervising physician: , MD

## 2024-03-09 ENCOUNTER — Ambulatory Visit (HOSPITAL_COMMUNITY)
Admission: RE | Admit: 2024-03-09 | Discharge: 2024-03-09 | Disposition: A | Discharge status: Still In Hospital | Payer: Self-pay | Source: Ambulatory Visit

## 2024-03-09 ENCOUNTER — Other Ambulatory Visit: Payer: Self-pay

## 2024-03-09 DIAGNOSIS — M545 Low back pain, unspecified: Secondary | ICD-10-CM

## 2024-03-09 NOTE — Progress Notes (Signed)
 [facility]  Outpatient Physical Therapy  [address]  [place], [address]  [phone]  (Fax) [phone]    Physical Therapy Progress Note    Date: 03/09/2024  Patient's Name: George Vincent.  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 Patient reports he was given 6 day course of oral steroids. Patient was given new order for continued PT 2x per week for 8 weeks. Patient reports reduced symptoms compared to last session. Patient reports he is still sleeping in recliner. Patient reports he feels he is slouched over. Patient notes difficulty with prolonged standing required for work and bed mobility.     Objective: Activities per flowsheet        AROM    Right IE  Right 8/21 Right 9/25 Right  10/14 Left IE  Left 8/21 Left 9/25 Left 10/14   Flexion 45 70 75 75 NT NT NT NT   Extension 0 10 15 10  NT NT NT NT   Sidebend 5 12 18 10 5 13 12 10    Rotation NT NT NT NT NT NT NT NT   ROM comments:     Strength (Manual Muscle Testing per [employer] Muscle Grading system)       Right IE  Right 8/21 Right 9/25 Left IE  Left 8/21 Left 9/25   Hip flexion (L1,2) 4+/5 4+/5 5/5 4+/5 5/5 5/5   Knee flexion (S1) 5/5 5/5 5/5 5/5 5/5 5/5   Knee extension (L2-4) 5/5 5/5 5/5 5/5 5/5 5/5   Ankle DF (L4-5) 5/5 5/5 5/5 5/5 5/5 5/5   Great toe ext  5/5 5/5 5/5 5/5 5/5 5/5   Ankle PF (S1,2) 5/5 5/5 5/5 5/5 5/5 5/5   Strength comments:     Reflexes       IE  8/25 9/25   Patellar 2+ B 2+ B 2+ B    Achilles 2+ B 2+B  2+ B    Clonus LE  ( - )  ( - )   ( - )           FUNCTIONAL TESTING:    8/21 9/25   5xSTS  11.33 Seconds  7.16 Seconds   30 sec STS  20 Reps 24 Reps           Measured ROM on  (mm/dd/yyyy):             EXERCISE/ACTIVITY NAME REPETITIONS RESISTANCE COMPLETED THIS DOS   HEP Review     N   Nu-step warm up  5  L 4.0  N   PPT  2x10   N   PPT with march 2x10   N   DKTC with ball 30x   N   Small range bridge with ball 2x15   N   Bridge  2x15   N   LTR with ball 15x   N   Lumbar Ball roll out FWD  10x10   N   Lumbar ball roll out diagonal 10x10ea  N   Good Posture/Bad posture  10xea   N   Manual SKTC  5x30ea   N   Manual piriformis S  5x30ea   N   Seated PPT  2x10   N   Seated marching   2x10   N   Standing hip ext small range  2x10ea Red N   Standing hip ABD  2x10 Red  N   Seated Anti-rotation with manual perturbations  20xea Black  N   STS with med ball TVA activation  20x #6.6 N   Modified Plank  2x5x5   N   Supine PPT UE raise with med ball 15x #6.6 N   Cat/Cow  15x   N   Bird Dog 10xea   DNT   Open Books 15xea Elbow bent Next   REP FIL  15x   N   Rep FIL with Rot R/L  10xea   N   Rep FISIT  20x   N   REP EIL x10   Y   Rep EIS 20x   N   Rep SGIS L and R  15xea   N   Lumbar ext machine  2x12 50# N   Ab machine  2x10 55# N   Leg Press  2x10 130# N   Box Lift  10x 10# N   SL RDL  10x 10# N   Lumbar STM      Y   Grade I-II Lumbar/thoracic P-A glides      Y   US  , 1.2      N   Prone hip ext 2x10ea   Y   Prone ALT UE/LE lifts 2x10ea With OP Y   Prone superman 2x5 With OP  N   E-stim Premod with MH  12 minutes    N           DISCONTINUED ACTIVITIES                                                Assessment: Patient demos fair tolerance to exercise program. Patient notes increased difficulty with posterior chain strengthening this date secondary to flare up of symptoms following fall. Patient to trial IDN next session.      Short-Term Goals: 4 Weeks      - Patient will report no more LE cramping ( MET 02/19/24  )     - Patient will be independent with progressive  HEP to maximize gains from PT. ( MET 01/15/24 )     - Patient will report max 2 LBP to aid in participation in PT. ( NOT MET 03/09/24 )     - Patient will report tolerance to reaching overhead ( MET 01/15/24 )     - Patient will exhibit functional trunk mobility and strength to allow non-limiting bed mobility activities due to pain.  ( Progressing 03/09/24  )      Long-Term Goals: 8 Weeks      - Patient will demonstrate improved BLE strength of at least 5/5 to aid in functional transfers. ( MET 02/19/24 )     - Patient will demonstrate 5 xSTS < 11 seconds ( MET 02/19/24  )     - Patient will demonstrate 30 sec STS >15 ( MET 01/15/24 )     - Patient will report sleep not disrupted by back  pain to aid in participation of functional ADLs during the day. ( MET 01/15/24 )     - Patient will report abolishment of radicular symptoms with worst subjective pain <=2/10 to allow return to community ambulation not limited by pain. ( PROGRESSING 03/09/24 )     -Patient will demonstrate tolerance to prolonged standing work on hard surface ( NEW 03/09/24 )     - Patient will demonstrate return to sleeping in bed full time ( NEW 03/09/24 )     -Patient will demonstrate tolerance to laying supine on hard surface ( NEW 03/09/24 )      Plan: Extend POC 2x per week 4 weeks from today's date    Total Session Time 45 and Timed code minutes 45  THERAPEUTIC EXERCISE 45 minutes      Chauncey Kalata, PT  03/09/2024, 15:14

## 2024-03-11 ENCOUNTER — Ambulatory Visit
Admission: RE | Admit: 2024-03-11 | Discharge: 2024-03-11 | Disposition: A | Discharge status: Still In Hospital | Payer: Self-pay | Source: Ambulatory Visit

## 2024-03-11 ENCOUNTER — Other Ambulatory Visit: Payer: Self-pay

## 2024-03-11 NOTE — Telephone Encounter (Signed)
 Spoke to pt. About CT scan results. 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 We agreed that he will obtain the number for a local pain mgt specialist and we will try to get him B/L SIJ inj for pain.

## 2024-03-11 NOTE — PT Evaluation (Signed)
 [facility] Medicine [facility]  Outpatient Physical Therapy  [address]  [place], [address]  [phone]  (Fax) [phone]    Physical Therapy Treatment Note    Date: 03/11/2024  Patient's Name: George Vincent.  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  I reviewed patient's initial evaluation, subsequent treatment sessions, and spoke with primary therapist on the patient's POC.       Subjective: Patient reports belt line pain that almost takes breath away.  Pain today feels like a cramp at the belt line.       Objective: Activities per flowsheet        Quantitative Sensory Testing (QST):  Test: Apply consistent pressure to each point.  A positive finding requires both objective (examiner's tactile interpretation) and subjective (patient's discomfort) criteria to be met.      Nerve Pathway Positive Finding (Yes/NO)   Nerve Pathway Positive Finding (Yes/NO)   Left Deep Radial (Point 1) No Right Deep Radial (Point 1) No   Left Deep Radial  (Point 2) No Right Deep Radial (Point 2) No   Left Deep Radial  (Point 3) No Right Deep Radial (Point 3) No   Left Deep Radial  (Point 4) No Right Deep Radial (Point 4) No    Total: 0  Total: 0       Nerve Pathway Positive Finding (Yes/NO)   Nerve Pathway Positive Finding (Yes/NO)   Left Saphenous (Point 1) No Right Saphenous (Point 1) No   Left Saphenous (Point 2) No Right Saphenous (Point 2) No   Left Saphenous (Point 3) No Right Saphenous (Point 3) No   Left Saphenous (Point 4) No Right Saphenous (Point 4) No    Total: 0  Total: 0            TOTAL NUMBER OF POSITIVE FINDINGS: 0       Scoring: The sum (total) of all 'Positive Findings'  0-4: Excellent prognosis with approximately 4 treatments  5-8: Good prognosis with approximately 8 treatments  9-12: Average prognosis with approximately 16 treatments  13+: Low response    Dry needling to the following: Homeostatic Points: (15) Posterior Cutaneous of L2 and (22) Posterior Cutaneous of L5  and Paravertebral Points: L1-L5 ; Depth: Superficial/Deep; Needle Length: 50mm; Needle amount used and removed: 10; Dry needling technique used: Electrical Stimulation.  Patient education on purpose, precautions, safety, risks, and other treatment options regarding dry needling provided.  Verbal and written consent received.    Patient tolerated dry needling well without adverse effects                  EXERCISE/ACTIVITY NAME REPETITIONS RESISTANCE COMPLETED THIS DOS   HEP Review     N   Nu-step warm up  5  L 4.0  N   PPT  2x10   N   PPT with march 2x10   N   DKTC with ball 30x   N   Small range bridge with ball 2x15   N  Bridge  2x15   N   LTR with ball 15x   N   Lumbar Ball roll out FWD  10x10   N   Lumbar ball roll out diagonal 10x10ea   N   Good Posture/Bad posture  10xea   N   Manual SKTC  5x30ea   N   Manual piriformis S  5x30ea   N   Seated PPT  2x10   N   Seated marching   2x10   N   Standing hip ext small range  2x10ea Red N   Standing hip ABD  2x10 Red  N   Seated Anti-rotation with manual perturbations  20xea Black  N   STS with med ball TVA activation  20x #6.6 N   Modified Plank  2x5x5   N   Supine PPT UE raise with med ball 15x #6.6 N   Cat/Cow  15x   N   Bird Dog 10xea   DNT   Open Books 15xea Elbow bent Next   REP FIL  15x   N   Rep FIL with Rot R/L  10xea   N   Rep FISIT  20x   N   REP EIL x10   N   Rep EIS 20x   N   Rep SGIS L and R  15xea   N   Lumbar ext machine  2x12 50# N   Ab machine  2x10 55# N   Leg Press  2x10 130# N   Box Lift  10x 10# N   SL RDL  10x 10# N   Lumbar STM      N    Grade I-II Lumbar/thoracic P-A glides      N   US  , 1.2      N   Prone hip ext 2x10ea   N   Prone ALT UE/LE lifts 2x10ea With OP N   Prone superman 2x5 With OP  N   E-stim Premod with MH  12 minutes    N           DISCONTINUED ACTIVITIES                                                Assessment: Tolerated IDN fairly well.  One significant muscle twitch to L1 on the right side.  Still with significant difficulty with prone to standing transitional movement having to walk up his legs with his hands to assume upright positioning.     Short-Term Goals: 4 Weeks      - Patient will report no more LE cramping ( MET 02/19/24  )     - Patient will be independent with progressive HEP to maximize gains from PT. ( MET 01/15/24 )     - Patient will report max 2 LBP to aid in participation in PT. ( NOT MET 03/09/24 )     - Patient will report tolerance to reaching overhead ( MET 01/15/24 )     - Patient will exhibit functional trunk mobility and strength to allow non-limiting bed mobility activities due to pain.  ( Progressing 03/09/24  )      Long-Term Goals: 8 Weeks      - Patient will demonstrate improved BLE strength of at least 5/5 to aid in functional transfers. ( MET 02/19/24 )     -  Patient will demonstrate 5 xSTS < 11 seconds ( MET 02/19/24  )     - Patient will demonstrate 30 sec STS >15 ( MET 01/15/24 )     - Patient will report sleep not disrupted by back pain to aid in participation of functional ADLs during the day. ( MET 01/15/24 )     - Patient will report abolishment of radicular symptoms with worst subjective pain <=2/10 to allow return to community ambulation not limited by pain. ( PROGRESSING 03/09/24 )      -Patient will demonstrate tolerance to prolonged standing work on hard surface ( NEW 03/09/24 )      - Patient will demonstrate return to sleeping in bed full time ( NEW 03/09/24 )      -Patient will demonstrate tolerance to laying supine on hard surface ( NEW 03/09/24 )      Plan: Extend POC 2x per week 4  weeks from today's date    Total Session Time 35 and Untimed code minutes 35  DRY NEEDLING THREE OR MORE MUSCLES      Elie Leppo, PT  03/11/2024, 16:00

## 2024-03-12 ENCOUNTER — Other Ambulatory Visit (HOSPITAL_COMMUNITY): Payer: Self-pay

## 2024-03-12 DIAGNOSIS — M545 Low back pain, unspecified: Secondary | ICD-10-CM

## 2024-03-16 ENCOUNTER — Ambulatory Visit
Admission: RE | Admit: 2024-03-16 | Discharge: 2024-03-16 | Disposition: A | Discharge status: Still In Hospital | Payer: Self-pay | Source: Ambulatory Visit

## 2024-03-16 ENCOUNTER — Other Ambulatory Visit: Payer: Self-pay

## 2024-03-16 NOTE — PT Treatment (Signed)
 [facility] Medicine [facility]  Outpatient Physical Therapy  [address]  [place], [address]  [phone]  (Fax) [phone]    Physical Therapy Treatment Note    Date: 03/16/2024  Patient's Name: George Vincent.  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 Patient reports good tolerance to initial dry needling and would like to try another session. Patient reports surgeon recommend BSIJ injections from pain management.     Objective: Activities per flowsheet         Patient arrived at 3:15 for 4:45 appointment, scheduled 3:15 patient no-showed. Patient agreeable to abbreviated session in this time slot.                EXERCISE/ACTIVITY NAME REPETITIONS RESISTANCE COMPLETED THIS DOS   HEP Review     N   Nu-step warm up  5  L 4.0  N   PPT  2x10   N   PPT with march 2x10   N   DKTC with ball 30x   N   Small range bridge with ball 2x15   N   Bridge  2x15   N   LTR with ball 15x   N   Lumbar Ball roll out FWD  10x10   N   Lumbar ball roll out diagonal 10x10ea   N   Good Posture/Bad posture  10xea   N   Manual SKTC  5x30ea   N   Manual piriformis S  5x30ea   N   Seated PPT  2x10   N   Seated marching   2x10   N   Standing hip ext small range  2x10ea Red N   Standing hip ABD  2x10 Red  N   Seated Anti-rotation with manual perturbations  20xea Black  N   STS with med ball TVA activation  20x #6.6 N   Modified Plank  2x5x5   N   Supine PPT UE raise with med ball 15x #6.6 N   Cat/Cow  15x   N   Bird Dog 10xea   DNT   Open Books 15xea Elbow bent Next   REP FIL  15x   N    Rep FIL with Rot R/L  10xea   N   Rep FISIT  20x   N   REP EIL x10   N   Rep EIS 20x   N   Rep SGIS L and R  15xea   N   Lumbar ext machine  2x12 50# N   Ab machine  2x10 55# N   Leg Press  2x10 130# N   Box Lift  10x 10# N   SL RDL  10x 10# N   Lumbar STM      Y   Grade I-II Lumbar/thoracic P-A glides      N   US  , 1.2      N   Prone hip ext 2x10ea   Y   Prone ALT UE/LE lifts 2x10ea With  OP Y   Prone superman x5 With OP  Y   E-stim Premod with MH  12 minutes    N           DISCONTINUED ACTIVITIES                                                Assessment: Patient demos fair tolerance to reintroduction to lumbar strengthening. Patient no longed requires clinician provided OP for assisted stabilization. Patient notes greatest difficulty with prone superman citing sensations of spasm.     Short-Term Goals: 4 Weeks      - Patient will report no more LE cramping ( MET 02/19/24  )     - Patient will be independent with progressive HEP to maximize gains from PT. ( MET 01/15/24 )     - Patient will report max 2 LBP to aid in participation in PT. ( NOT MET 03/09/24 )     - Patient will report tolerance to reaching overhead ( MET 01/15/24 )     - Patient will exhibit functional trunk mobility and strength to allow non-limiting bed mobility activities due to pain.  ( Progressing 03/09/24  )      Long-Term Goals: 8 Weeks      - Patient will demonstrate improved BLE strength of at least 5/5 to aid in functional transfers. ( MET 02/19/24 )     - Patient will demonstrate 5 xSTS < 11 seconds ( MET 02/19/24  )     - Patient will demonstrate 30 sec STS >15 ( MET 01/15/24 )     - Patient will report sleep not disrupted by back pain to aid in participation of functional ADLs during the day. ( MET 01/15/24 )     - Patient will report abolishment of radicular symptoms with worst subjective pain <=2/10 to allow return to community ambulation not limited by pain. ( PROGRESSING 03/09/24 )      -Patient will demonstrate tolerance to prolonged  standing work on hard surface ( NEW 03/09/24 )      - Patient will demonstrate return to sleeping in bed full time ( NEW 03/09/24 )      -Patient will demonstrate tolerance to laying supine on hard surface ( NEW 03/09/24 )      Plan: Continue per POC     Total Session Time 25 and Timed code minutes 25  THERAPEUTIC EXERCISE 25 minutes      Chauncey Kalata, PT  03/16/2024, 15:38

## 2024-03-23 ENCOUNTER — Ambulatory Visit (HOSPITAL_COMMUNITY): Admission: RE | Admit: 2024-03-23 | Payer: Self-pay | Source: Ambulatory Visit

## 2024-03-23 ENCOUNTER — Other Ambulatory Visit: Payer: Self-pay

## 2024-03-25 ENCOUNTER — Other Ambulatory Visit: Payer: Self-pay

## 2024-03-25 ENCOUNTER — Ambulatory Visit
Admission: RE | Admit: 2024-03-25 | Discharge: 2024-03-25 | Disposition: A | Discharge status: Still In Hospital | Payer: Self-pay | Source: Ambulatory Visit

## 2024-03-25 NOTE — PT Treatment (Signed)
 [facility] Medicine [facility]  Outpatient Physical Therapy  [address]  [place], [address]  [phone]  (Fax) [phone]    Physical Therapy Discharge Note    Date: 03/25/2024  Patient's Name: George Vincent.  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 Patient reports he feels he has stopped making progress with therapy. Patient continues to note significant upper lumbar and thoracic spine pain with laying supine. Patient continues to note popping associated with significant pain when laying supine. Patient notes the pain takes his breath away. Patient notes he is now having increased pain with sneezing and coughing. Patient notes he is not able to sleep in his bed due to symptoms. Patient reports he is limiting his activity and chores secondary to pain and apprehension.      Objective: Activities per flowsheet          AROM    Right IE  Right 8/21 Right 9/25 Right  10/14 Right 10/30 Left IE  Left 8/21 Left 9/25 Left 10/14 Left 10/30   Flexion 45 70 75 75 50 NT NT NT NT NT   Extension 0 10 15 10 10  NT NT NT NT NT   Sidebend 5 12 18 10 8 5 13 12 10 10    Rotation NT NT NT NT NT NT NT NT NT NT   ROM comments:     Strength (Manual Muscle Testing per [employer] Muscle Grading system)       Right IE  Right 8/21 Right 9/25 Left IE  Left 8/21 Left 9/25   Hip flexion (L1,2) 4+/5 4+/5 5/5 4+/5 5/5 5/5   Knee flexion (S1) 5/5 5/5 5/5 5/5 5/5 5/5   Knee extension (L2-4) 5/5 5/5 5/5 5/5 5/5 5/5   Ankle DF (L4-5) 5/5 5/5 5/5 5/5 5/5 5/5   Great toe ext  5/5 5/5 5/5 5/5 5/5 5/5    Ankle PF (S1,2) 5/5 5/5 5/5 5/5 5/5 5/5   Strength comments:     Reflexes       IE  8/25 9/25 10/30   Patellar 2+ B 2+ B 2+ B  2+ B   Achilles 2+ B 2+B  2+ B  2+ B   Clonus LE  ( - )  ( - )   ( - )  ( - )          FUNCTIONAL TESTING:    8/21 9/25 10/30   5xSTS  11.33 Seconds  7.16 Seconds 6.23 seconds   30 sec STS  20 Reps 24 Reps  28 reps           EXERCISE/ACTIVITY NAME REPETITIONS RESISTANCE COMPLETED THIS DOS   HEP Review     Y   Nu-step warm up  5  L 4.0  N   PPT  2x10   N   PPT with march 2x10   N   DKTC with ball 30x   N   Small range bridge with ball 2x15   N  Bridge  2x15   N   LTR with ball 15x   N   Lumbar Ball roll out FWD  10x10   N   Lumbar ball roll out diagonal 10x10ea   N   Good Posture/Bad posture  10xea   N   Manual SKTC  5x30ea   N   Manual piriformis S  5x30ea   N   Seated PPT  2x10   N   Seated marching   2x10   N   Standing hip ext small range  2x10ea Red N   Standing hip ABD  2x10 Red  N   Seated Anti-rotation with manual perturbations  20xea Black  N   STS with med ball TVA activation  20x #6.6 N   Modified Plank  2x5x5   N   Supine PPT UE raise with med ball 15x #6.6 N   Cat/Cow  15x   N   Bird Dog 10xea   DNT   Open Books 15xea Elbow bent Next   REP FIL  15x   N   Rep FIL with Rot R/L  10xea   N   Rep FISIT  20x   N   REP EIL x10   N   Rep EIS 20x   N   Rep SGIS L and R  15xea   N   Lumbar ext machine  2x12 50# N   Ab machine  2x10 55# N   Leg Press  2x10 130# N   Box Lift  10x 10# N   SL RDL  10x 10# N   Lumbar STM      N   Grade I-II Lumbar/thoracic P-A glides      N   US  , 1.2      N   Prone hip ext 2x10ea   N   Prone ALT UE/LE lifts 2x10ea With OP N   Prone superman x5 With OP  N   E-stim Premod with MH  12 minutes    N   Objective reassessment    Y           DISCONTINUED ACTIVITIES                                                Assessment: Patient progress with PT has plateaued at this time. Patient continues to note significant lumbar and thoracic pain. Patient reports  significant pain associated with popping when laying supine. This has been observed and felt in clinic. Patient is unable to tolerate supine, seated or prone exercises at this time secondary to pain. Patient current symptoms are significantly impacting patient's QOL. Have attempted to manage symptoms with variety of modalities, changes to exercise program, changes to exercise intensity and manual therapies without success. Patient demos decreased lumbar AROM compared to last reassessment. At this time will D/C patient from POC and refer back to referring provider for further medical management/diagnosis of symptoms.      Short-Term Goals: 4 Weeks      - Patient will report no more LE cramping ( MET 02/19/24  )     - Patient will be independent with progressive HEP to maximize gains from PT. ( MET 01/15/24 )     - Patient will report max 2 LBP to aid in participation in PT. ( NOT MET 03/09/24 )     -  Patient will report tolerance to reaching overhead ( MET 01/15/24 )     - Patient will exhibit functional trunk mobility and strength to allow non-limiting bed mobility activities due to pain.  ( NOT MET 03/25/24)      Long-Term Goals: 8 Weeks      - Patient will demonstrate improved BLE strength of at least 5/5 to aid in functional transfers. ( MET 02/19/24 )     - Patient will demonstrate 5 xSTS < 11 seconds ( MET 02/19/24  )     - Patient will demonstrate 30 sec STS >15 ( MET 01/15/24 )     - Patient will report sleep not disrupted by back pain to aid in participation of functional ADLs during the day. ( MET 01/15/24 )     - Patient will report abolishment of radicular symptoms with worst subjective pain <=2/10 to allow return to community ambulation not limited by pain. ( NOT MET 03/25/24 )      -Patient will demonstrate tolerance to prolonged standing work on hard surface ( NOT MET 03/25/24)      - Patient will demonstrate return to sleeping in bed full time ( NOT MET 03/25/24 )      -Patient will demonstrate tolerance to  laying supine on hard surface ( NOT MET 03/25/34)      Plan: D/C from PT and refer back to Dr. Helon for further assessment.     Total Session Time 35 and Timed code minutes 35  THERAPEUTIC EXERCISE 35 minutes    Chauncey Kalata, PT  03/25/2024, 15:24

## 2024-03-30 ENCOUNTER — Ambulatory Visit (HOSPITAL_COMMUNITY): Payer: Self-pay

## 2024-04-01 ENCOUNTER — Ambulatory Visit (HOSPITAL_COMMUNITY): Payer: Self-pay

## 2024-04-27 NOTE — Progress Notes (Signed)
 ASSESSMENT / PLAN:  Psychosocial risk factors:     Symptoms: Back pain.    Pathology: 5 months out from L4-S1 ALIF/posterior spinal fusion with stable hardware.    Fracture left anterior aspect of the superior endplate of L4.    Interventional procedure options:       Surgical options:       DAX generated assessment/plan:  Assessment & Plan  1. Back pain:  The back pain is attributed to a non-displaced fracture at L4, confirmed by CT scan and MRI. The fracture is not life-threatening but causes significant discomfort. Mild spinal stenosis at L3-4 and some arthritis above the surgical site were also noted, though these are not severe enough to be the primary pain source. The pain is expected to improve as the fracture heals.    The treatment plan includes wearing a brace to support the fracture and taking ibuprofen or Aleve  to manage inflammation and pain. An MRI will be ordered to gather more information. If there is no improvement after 4 to 6 weeks, surgical intervention may be considered. The patient was advised to avoid rushing into surgery and to see how he feels after the holidays before making further decisions. The patient was also informed that the fracture should heal over time, and the pain should improve as a result.    Follow-up: January 2026.         Treatment plan:   Orders Placed This Encounter   . X-ray lumbar spine 2 or 3 views (72100)   . MRI lumbar spine without contrast (27851)   . BP Patient Education        Work Status:     Miscellaneous:     Follow-up: No follow-ups on file.     HISTORY OF PRESENT ILLNESS:  George Vincent.; [id number]   Age: 58 y.o. Sex: male   Pain score: Pain rating = 10-Worst pain ever out of 10     Chief Complaint: Pain of the Lower Back    History of present illness:  George Vincent. is a 58 y.o. male with complaints of Pain of the Lower Back.  The pain is rated 10-Worst pain ever  out of 10 on the VAS.    No submissions are found.     DAX generated history:      History of Present Illness  The patient is a 58 year old gentleman presenting today with back pain, which he rates as 10 out of 10 in severity.    He reports that his back pain began approximately 12 weeks post-surgery, following a period of significant improvement during which he was able to walk nearly 2 miles daily. The onset of the pain coincided with the initiation of physical therapy involving bending and twisting movements. He describes a sensation akin to an inflated balloon between the two rods in his back when lying on a firm surface, which decompresses and produces an audible pop. This phenomenon has been observed by his physical therapist on multiple occasions. The pain is so severe that it causes him to urinate involuntarily. To alleviate the pain, he must slide off the surface without engaging his back muscles, as any attempt to do so results in excruciating pain. Once upright, the pain subsides. He also reports a constant sensation of pressure in his rib cage and back, similar to having a dislocated rib.     The pain was gradual in onset and has been progressively worsening over the past week and a half. He  has attempted to use a brace for relief but found it uncomfortable. He also experiences aching and soreness in the rod on his right side, particularly at the bottom. He recalls being informed of a cyst during his first surgery, which was left untreated as it was not causing discomfort. However, following his second surgery, he experienced burning and cramping sensations down both legs, which brought him to tears. He also reports spasms when lying on a firm surface, a symptom that has worsened since his surgery.    SOCIAL HISTORY  Exercise: Walking almost 2 miles a day    HPI     Patient Active Problem List    Diagnosis Date Noted   . S/P lumbar fusion 12/09/2023   . Hypertensive disorder 08/25/2023   . Pain in thoracic spine 03/11/2022   . Thoracic radiculopathy 03/11/2022       Family History    Problem Relation Age of Onset   . Diabetes Mother    . Coronary artery disease Mother    . Clotting disorder Mother    . No Known Problems Father    . No Known Problems Brother    . No Known Problems Sister    . No Known Problems Son    . No Known Problems Daughter    . No Known Problems Other         Social History     Tobacco Use   . Smoking status: Never   . Smokeless tobacco: Never   Substance Use Topics   . Alcohol use: Never       Past Medical History:   Diagnosis Date   . Hypertension         Past Surgical History:   Procedure Laterality Date   . NO RELEVANT ORTHOPAEDIC SURGERIES     . NO RELEVANT SURGERIES     . SHOULDER SURGERY     . SPINE SURGERY            Current Outpatient Medications:   .  hydrochlorothiazide  (HYDRODIURIL ) 12.5 MG tablet, Take 12.5 mg by mouth once daily, Disp: , Rfl:   .  potassium chloride  (MICRO-K ) 10 MEQ CR capsule, Take 10 mEq by mouth daily, Disp: , Rfl:   .  valsartan  (DIOVAN ) 80 MG tablet, Take 1 tablet by mouth 2 (two) times a day, Disp: , Rfl:     No Known Allergies     ROS:   No new bowel or bladder incontinence.  No fever.  No saddle anesthesia.    OBJECTIVE:  BP (!) 130/85   Ht 6'   Wt 260 lb (118 kg)   BMI 35.26 kg/m   Body mass index is 35.26 kg/m., a BMI over 30 is considered obese and a BMI over 40 has been associated with a higher risk of surgical complications.    Constitutional: No acute distress. Well nourished.  HEENT: Normocephalic.  Respiratory:  No labored breathing.  Cardiovascular:  No marked cyanosis.  Skin:  No marked skin ulcers/lesions on bilateral upper or lower extremities.  Musculoskeletal/Neurological:   +1 DTRs               DAX generated physical exam:  Physical Exam  Musculoskeletal:  lower back: Tenderness noted. Evidence of compensatory muscle tightening. No deformities observed.    RESULTS REVIEWED:   Order: XR SPINE LUMBAR 2 OR 3 VW - Indication: Lumbar pain       MRI lumbar spine without contrast (27851)  Result Date: 04/29/2024  OV HEN  MRI EXAM:  MRI LUMBAR SPINE W/O CONT INDICATION: , M54.50:Low back pain, unspecified;M47.816:Spondylosis without myelopathy or radiculopathy, lumbar region;M51.369:Other intervertebral disc degeneration, lumbar region without mention of lumbar back pain or lower extremity pain;S32.042G:Unstable burst fracture of fourth lumbar vertebra, subsequent encounter for fracture with delayed healing COMPARISON: None. TECHNIQUE: Multiplanar multisequence MRI acquisition of the lumbar spine without IV contrast administration. CONTRAST: None. FINDINGS: The last well-formed disk is designated as L5-S1 for the purpose of this report. Vertebral bodies were numbered using this convention. Sequelae status post prior L4-L5 and L5-S1 anterior interbody fusion and disc fusion cage with L5 and S1 posterior transpedicular screw and rod spinal fusion. Hardware associated signalization artifact precludes optimal evaluation of subjacent structures There is no significant vertebral listhesis. Vertebral body heights are maintained without evidence of acute fracture. There is no obvious marrow replacing lesions, allowing for artifacts and lack of IV contrast administration.  The conus is normal in size and signal and terminates at L1. The cauda equina is unremarkable. Multilevel degenerative disc signal changes. T12-L1: Mild posterior disc bulge without significant spinal canal narrowing. No significant neuroforaminal narrowing. L1-L2: Disc bulge contributes to mild spinal canal narrowing. No significant neuroforaminal narrowing. L2-L3: No significant disc protrusion, spinal canal or neuroforaminal narrowing. L3-L4: Disc bulge with superimposed left subarticular/foraminal broad-based disc protrusion compressing versus impinging the descending left L4 nerve root within the effaced subarticular recess. Moderate facet arthrosis with ligamentum flavum thickening.  Associated mild central spinal canal narrowing. Mild left neuroforaminal narrowing.  L4-L5: Surgically fused level without significant spinal canal narrowing or neuroforaminal narrowing. Extraforaminal abutment of exiting right and left L4 nerve root by degenerative disc material also noted. L5-S1: Surgically fused level with patent spinal canal. No significant neuroforaminal narrowing. Extraforaminal abutment of the exiting bilateral L5 nerve roots by degenerative disc material also noted. Visualized soft tissues are unremarkable. IMPRESSION: Postsurgical changes from remote anterior and posterior spinal fusion at L4-S1 as detailed. Mild spondylosis without high-grade spinal canal or neuroforaminal narrowing. George Vincent 04/29/2024 1:30 PM /n    X-ray lumbar spine 2 or 3 views (27899)  Result Date: 04/29/2024  Standing. AP, Lat.     Impression: Today I have ordered and reviewed lumbar spine x-rays.  They show age-appropriate degenerative changes.  S/p L4-S1 360 degree fusion with left sided superior endplate fracture of L4.       I have personally and independently reviewed the CT lumbar spine performed at [facility] on 03/11/24.  It shows age appropriate degenerative changes.  L4-S1 fusion with stable hardware.  Fracture left anterior aspect of the superior endplate of L4.    DAX generated test results:  Results  The following results are independently reviewed and interpreted by myself.    Imaging   - CT scan of the lumbar spine: Shows a crack in the L4 vertebra above the screws from a previous fusion surgery.   - MRI of the lumbar spine: 04/29/2024, Shows an L4-S1 fusion in good alignment, a superior endplate fracture at L4 on the left side, and mild stenosis at L3-4.        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 Hypertensive disorder   . Pain in thoracic  spine   . Thoracic radiculopathy   . S/P lumbar fusion       PDMP reviewed by     Our practice treats spine problems with a comprehensive, multi-modal approach involving rehab, short course of non-narcotic medications, injections and surgery (if clinically indicated).  Today we discussed the diagnosis and engaged in the shared decision-making process regarding testing, treatment options, along with risks and benefits.  Cordella Xenia Raddle. (and present family/friends) was/were an active participant on this conversation and had all questions satisfactorily answered. The agreed upon treatment plan after today's visit is listed on the plan section above.  If medication treatment is effective and becomes a long term solution, we recommend transferring the prescriptions to the PCP for monitoring of labs and interactions.    This patient has been enrolled and is participating in a physician directed physical therapy program as part of their spinal care treatment plan.  This program was designed by the [employer] and is medically necessary to address pain, restore functional mobility, and improve core strength and stability.  Specific exercises and prescribed frequency are outlined below.  The program is being monitored under the supervision of the referring spine physician or APC (unless otherwise stated) to ensure therapeutic benefit and to support safe return to daily activities.    Purpose of Program   After an injury or surgery, an exercise conditioning program will help you return to daily activities and enjoy a more active, healthy lifestyle. Following a well-structured conditioning program will also help you return to sports and other recreational activities.     This is a general conditioning program that provides a wide range of exercises. To ensure that the program is safe and effective for you, it should  be performed under your doctor's supervision. Talk to your doctor or physical therapist about which exercises will best help you meet your rehabilitation goals.     Strength: Strengthening the muscles that support your spine will help keep your back and upper body stable. Keeping these muscles strong can relieve back pain and prevent further injury.     Flexibility: Stretching the muscles that you strengthen is important for restoring range of motion and preventing injury. Gently stretching after strengthening exercises can help reduce muscle soreness and keep your muscles long and flexible.     Target Muscles: The muscle groups targeted in this conditioning program include:   Cervical spine (neck)  External oblique rotators (side and lower back)  Trapezius (neck and upper back)  Internal oblique rotators (side and lower back)  Latissimus dorsi (side and middle back)  Piriformis (buttocks)  Back extensors and erector spinae  Gluteus maximus (buttocks) (middle and lower back)  Gluteus medias (buttocks)  Quadratus lumborum (lower back)  Hamstrings (back of thigh)  Abdominals    Length of program: This spine conditioning program should be continued for 4 to 6 weeks, unless otherwise specified by your doctor or physical therapist. After your recovery, these exercises can be continued as a maintenance program for lifelong protection and health of your spine. Performing the exercises two to three days a week will maintain strength and range of motion in your back.     Getting Started   Warm up: Before doing the following exercises, warm up with 5 to 10 minutes of low impact activity, like walking or riding a stationary bicycle.     Stretch: After the warm-up, do the stretching exercises shown on Page 1 before moving on to the strengthening exercises. When you  have completed the strengthening exercises, repeat the stretching exercises to end the program.     Do not ignore pain: You should not feel pain during an exercise.  Talk to your doctor or physical therapist if you have any pain while exercising.     Ask questions: If you are not sure how to do an exercise, or how often to do it, contact your doctor or physical therapist.     1. Head Rolls   Repetitions 3 sets of 3  Days per week Daily   Main muscles worked: Cervical spine muscles, trapezius  You should feel this stretch all around your neck and into your upper back  Equipment needed: None   Step-by-step directions  Sit in a chair or stand with your weight evenly distributed on both feet.  Gently bring your chin toward your chest.  Roll your head to the right and turn so that your ear is over your shoulder (1). Hold for 5 seconds.   Gently roll your head back toward your chest and to the left. Turn your head so that your ear is over your left shoulder (2). Hold for 5 seconds.   Slowly roll your head back and in a clockwise circle three times (3).  Reverse directions and slow roll your head in a counterclockwise circle three times (4).   Tip Do not shrug your shoulders up during this exercise.       2. Kneeling Back Extension   Repetitions 10  Days per week Daily   Main muscles worked: Quadratus lumborum, erector spinae  You should feel this stretch in your lower back and your abdominals  Equipment needed: None   Step-by-step directions  Begin on your hands and knees with your shoulders positioned over your hands.  Rock forward onto your arms, round your shoulders and allow your low back to drop toward the floor. Hold for 5 seconds.   Rock backward and sit your buttocks as close to your heels as possible. Extend your arms and hold for 5 seconds.   Tip Look down on the floor to keep your neck in alignment with your spine.       3. Sitting Rotation Stretch   Repetitions 2 sets of 4  Days per week Daily   Main muscles worked: Piriformis, external oblique rotators, internal oblique rotators  You should feel this stretch in your buttocks, as well as at your sides  Equipment needed:  None   Step-by-step directions  Sit on the floor with both legs straight out in front of you. Cross one leg over the other.   Slowly twist toward your bent leg, putting your hand behind you for support.  Place your opposite arm on the side of your bent thigh and use it to help you twist further.   Look over your shoulder and hold the stretch for 30 seconds. Slowly come back to center.   Repeat on the other side. Repeat the entire sequence 4 times.  Tip Sit up tall and keep your sit bones pressed into the floor throughout the stretch.       4. Modified Seat Side Straddle   Repetitions 10 each side  Days per week Daily   Main muscles worked: Hamstrings, extensor muscles, erector spinae  You should feel this stretch in the back of your thighs and into your lower and middle back  Equipment needed: None   Step-by-step directions  Sit on the floor with one leg extended to the side and  the other leg bent.  Keep your back straight and bend from your hips toward the foot of your straight leg. Reach your hands toward your toes and hold for 5 seconds.   Slowly round your spine and bring your hands to your shin or ankle. Bring your head down as close to your knee as possible.   Hold for 30 seconds and then relax for 30 seconds.  Repeat on the other side. Repeat the sequence 10 times.  Tip Keep your extended leg straight as you bring your head down.       5. Knee to Chest   Repetitions 3 sets of 10  Days per week Daily   Main muscles worked: Quadratus lumborum  You should feel this stretch in your lower back, as well as in the front of your hip and inner thigh  Equipment needed: None   Step-by-step directions  Lie on your back on the floor.  Lift one leg and bring your knee toward your chest. Grasp your knee or shin and pull your leg in as far as it will go.   Tighten your abdominals and press your spine to the floor. Hold for 5 seconds.  Repeat on the other side, then pull both legs in together. Repeat the sequence 10 times.    Tip Keep your spine aligned to the floor throughout the sequence.       6. Bird Dog   Repetitions 5  Days per week Daily   Main muscles worked: Back extensors, erector spinae, gluteal muscles  You should feel this exercise in your lower back and into your buttocks  Equipment needed: None   Step-by-step directions  Begin on your hands and knees with your shoulders positioned over your hands and your hips directly over your knees.   Tighten your abdominal muscles and raise one arm straight out to shoulder-height and level with your body. Hold until you feel balanced.   Slowly lift and extend the opposite leg straight out from your hip.  Tighten the muscles in your buttocks and thigh, and hold this position for 15 seconds.  Slowly return to the start position and repeat with the opposite arm and leg.  Tip Keep your stomach muscles tight and your back flat to stay balanced.       7. Plank   Repetitions 5  Days per week Daily   Main muscles worked: Back extensors, erector spinae, quadratus lumborum, abdominals  You should feel this exercise in your middle to lower back, abdominals, and gluteal muscles  Equipment needed: None   Step-by-step directions  Lie on your stomach with your forearms on the floor and your elbows directly below your shoulders.   Tighten your abdominal muscles and lift your hips off of the floor.  Squeeze your gluteal muscles and lift your knees off of the floor.  Keep your body straight and hold for 30 seconds. If you cannot hold this position, bring your knees back to the floor and hold with just your hips lifted.   Slowly return to the start position and rest 30 seconds. Repeat.  Tip Do not let your pelvis sag toward the floor. Keep your stomach muscles tight.       8. Modified Side Plank   Repetitions 5  Days per week Daily   Main muscles worked: Quadratus lumborum, external oblique rotators, internal oblique rotators You should feel this exercise in your lower back, waist, and  abdominals  Equipment needed: None   Step-by-step directions  Lie on your side on the floor with your bottom leg slightly bent and top leg straight. Your elbow should be directly under your shoulder with your forearm extended on the floor in front of you.   Tighten your abdominal muscles and raise your hip off of the floor.  If you can, straighten your bottom leg and lift your knee off of the floor as shown.  Keep your body straight and hold this position for 15 seconds.  Slowly return to the start position and repeat on the other side.  Tip Keep neck in alignment with your spine and do not shrug your shoulder up to your ear.       9. Hip Bridge   Repetitions 5  Days per week Daily   Main muscles worked: Lower back extensor, erector spinae, gluteal muscles, hamstrings  You should feel this exercise in your lower back, buttocks, and back of your thigh  Equipment needed: None   Step-by-step directions  Lie on your back on the floor with your arms at your sides, your knees bent, and your feet flat on the floor.   Tighten your abdominal and gluteal muscles and lift your pelvis so that your body is in a straight line from your shoulders to your knees.   Hold this position for 15 seconds.  Slowly return to the start position and repeat.  Tip Center your weight over your shoulder blades. Do not tense up in your neck.       10. Abdominal Bracing   Repetitions 5  Days per week Daily   Main muscles worked: Abdominals  You should feel this exercise in your stomach muscles  Equipment needed: None   Step-by-step directions  Lie on your back on the floor with your knees bent and arms at your sides.  Tighten your abdominal muscles so that your stomach pulls away from your waistband.  Hold this position for 15 seconds.  Tip Flatten your lower back into the floor.       11. Abdominal Crunch   Repetitions 2 sets of 10  Days per week Daily   Main muscles worked: Abdominals  You should feel this exercise in your stomach  muscles  Equipment needed: None   Step-by-step directions  Lie on your back on the floor with your knees bent and hands at the back of your head with your elbows open wide.   Tighten your abdominal muscles and lift your head and shoulder blades off of the floor.   Keep your back flat to the floor and hold for 2 seconds.  Slowly lower and repeat.  Tip Relax your neck and do not pull on your head with your hands.           This document was prepared using Teacher, Early Years/pre software, with the patient's or patient's representative's consent. As with any speech-to-text technology, unintentional errors in grammar, syntax, word choice (including homophones), or interpretation may occur. While efforts have been made to proofread and ensure accuracy, some errors may remain. We appreciate your understanding.      This document was prepared using Teacher, Early Years/pre software, with the patient's or patient's representative's consent. As with any speech-to-text technology, unintentional errors in grammar, syntax, word choice (including homophones), or interpretation may occur. While efforts have been made to proofread and ensure accuracy, some errors may remain. We appreciate your understanding.    Lauree Frieze, MD

## 2024-05-01 ENCOUNTER — Other Ambulatory Visit (RURAL_HEALTH_CENTER): Payer: Self-pay | Admitting: Family Medicine

## 2024-05-01 DIAGNOSIS — I10 Essential (primary) hypertension: Secondary | ICD-10-CM

## 2024-06-13 ENCOUNTER — Other Ambulatory Visit (RURAL_HEALTH_CENTER): Payer: Self-pay | Admitting: Family Medicine

## 2024-06-14 ENCOUNTER — Other Ambulatory Visit: Payer: Self-pay

## 2024-06-15 ENCOUNTER — Ambulatory Visit (RURAL_HEALTH_CENTER): Payer: Self-pay | Attending: Family Medicine | Admitting: Family Medicine

## 2024-06-15 ENCOUNTER — Encounter (RURAL_HEALTH_CENTER): Payer: Self-pay | Admitting: Family Medicine

## 2024-06-15 VITALS — BP 130/90 | HR 67 | Temp 98.1°F | Resp 20 | Wt 261.2 lb

## 2024-06-15 DIAGNOSIS — G8929 Other chronic pain: Secondary | ICD-10-CM | POA: Insufficient documentation

## 2024-06-15 DIAGNOSIS — Z1331 Encounter for screening for depression: Secondary | ICD-10-CM | POA: Insufficient documentation

## 2024-06-15 DIAGNOSIS — R7303 Prediabetes: Secondary | ICD-10-CM | POA: Insufficient documentation

## 2024-06-15 DIAGNOSIS — Z981 Arthrodesis status: Secondary | ICD-10-CM | POA: Insufficient documentation

## 2024-06-15 DIAGNOSIS — Z724 Inappropriate diet and eating habits: Secondary | ICD-10-CM | POA: Insufficient documentation

## 2024-06-15 DIAGNOSIS — Z79899 Other long term (current) drug therapy: Secondary | ICD-10-CM | POA: Insufficient documentation

## 2024-06-15 DIAGNOSIS — Z125 Encounter for screening for malignant neoplasm of prostate: Secondary | ICD-10-CM | POA: Insufficient documentation

## 2024-06-15 DIAGNOSIS — E782 Mixed hyperlipidemia: Secondary | ICD-10-CM | POA: Insufficient documentation

## 2024-06-15 DIAGNOSIS — M51369 Other intervertebral disc degeneration, lumbar region without mention of lumbar back pain or lower extremity pain: Secondary | ICD-10-CM | POA: Insufficient documentation

## 2024-06-15 DIAGNOSIS — M546 Pain in thoracic spine: Secondary | ICD-10-CM | POA: Insufficient documentation

## 2024-06-15 DIAGNOSIS — I10 Essential (primary) hypertension: Secondary | ICD-10-CM | POA: Insufficient documentation

## 2024-06-15 MED ORDER — VALSARTAN 80 MG TABLET: ORAL_TABLET | ORAL | 2 refills | Status: AC

## 2024-06-15 MED ORDER — HYDROCHLOROTHIAZIDE 12.5 MG TABLET: 12.5000 mg | ORAL_TABLET | Freq: Every day | ORAL | 3 refills | Status: AC

## 2024-06-15 MED ORDER — POTASSIUM CHLORIDE ER 10 MEQ CAPSULE,EXTENDED RELEASE: 10.0000 meq | ORAL_CAPSULE | Freq: Every day | ORAL | 3 refills | Status: AC

## 2024-06-15 NOTE — Progress Notes (Signed)
 FAMILY MEDICINE, [facility]  [address]  [place] [address]  Operated by [facility]     Name: George Vincent. MRN:  [id number]   Date: 06/15/2024 Age: 59 y.o.          Provider: Harlene Spire, DO    Reason for visit: Follow Up 6 Months (7 months /No concerns)      History of Present Illness  George Vincent. is a 59 year old male who presents with ongoing thoracic and lower back pain.    Thoracic pain  - Chronic thoracic pain ongoing x years. Has seen numerous specialists and had MRI's as well as received chiropractor therapy and PT.  - Pain described as 'horrible' when lying down, requiring sleep in a recliner  - Pain exacerbated within 5-10 minutes of lying down  - Coughing intensifies thoracic pain  - Pain radiates down to the tailbone  - Lying on firm surfaces worsens discomfort and may cause a popping sensation  - Physical therapy worsened thoracic symptoms, leading to discontinuation  Previous MRI in 2024 showed degenerative changes    Lower back pain and stiffness  - Ongoing lower back pain s/p spinal fusion and sensation of tightness, especially upon waking  - Tightness improves after being upright for a while  - He does report improvement in the pain in his feet and burning in his legs/feet post surgery    History of fall and vertebral fracture  - Fell from a height of approximately seven feet in October  - Initial fracture not identified; later diagnosed and under ongoing care  - Anticipates further evaluation, including possible x-rays, to assess healing and persistent thoracic pain    Postoperative status and leg symptoms  - Underwent surgery to address leg pain and numbness  - Surgery resulted in approximately 90% improvement in leg symptoms    Essential Hypertension  - Home BP monitoring reported as excellent.        PHQ Questionnaire  Little interest or pleasure in doing things.: Not at all  Feeling down, depressed, or hopeless: Not at  all  PHQ 2 Total: 0  Trouble falling or staying asleep, or sleeping too much.: Not at all  Feeling tired or having little energy: Not at all  Poor appetite or overeating: Not at all  Feeling bad about yourself/ that you are a failure in the past 2 weeks?: Not at all  Trouble concentrating on things in the past 2 weeks?: Not at all  Moving/Speaking slowly or being fidgety or restless  in the past 2 weeks?: Not at all  Thoughts that you would be better off DEAD, or of hurting yourself in some way.: Not at all  If you checked off any problems, how difficult have these problems made it for you to do your work, take care of things at home, or get along with other people?: Not difficult at all  PHQ 9 Total: 0  Interpretation of Total Score: 0-4 No depression              06/15/2024     8:09 AM   GAD-7 Questionnaire   Feeling nervous,anxious,on edge 0   Not being able to stop or control worrying 0   Worrying too much about different things 0   Trouble relaxing 0   Being so restless that it is hard to sit still 0   Becoming easily annoyed or irritable 0   Feeling afraid as if  something awful might happen 0   How difficult have these problems made it for you to work, take care of things at home, or get along with other people? Not difficult at all   Gad-7 Score Total 0   Interpretation 0-4, normal             Historical Data    Past Medical History:  Past Medical History:   Diagnosis Date    History of kidney stones     Hypertension     Inguinal hernia          Allergies:  Allergies[1]  Medications:  Current Outpatient Medications   Medication Sig    hydroCHLOROthiazide  (HYDRODIURIL ) 12.5 mg Oral Tablet TAKE 1 TABLET ONCE DAILY    potassium chloride  (MICRO K) 10 mEq Oral Capsule, Sustained Release Take 1 Capsule (10 mEq total) by mouth Daily    valsartan  (DIOVAN ) 80 mg Oral Tablet Takes one tablet morning and 1/2 in evening     Social History:  Social History     Socioeconomic History    Marital status: Single   Tobacco Use     Smoking status: Never    Smokeless tobacco: Never   Vaping Use    Vaping status: Never Used   Substance and Sexual Activity    Alcohol use: Never    Drug use: Never           Review of Systems:  Other than ROS in the HPI, all other systems were negative.    Physical Exam:  Vital Signs:  Vitals:    06/15/24 0811   BP: (!) 130/90   Pulse: 67   Resp: 20   Temp: 36.7 C (98.1 F)   SpO2: 96%   Weight: 119 kg (261 lb 4 oz)       Vitals and nursing note reviewed.     Physical Exam  CONSTITUTIONAL: Patient is not in acute distress, normal appearance.  HEAD EARS NOSE THROAT: Mucous membranes are moist.  EYES: Pupils are equal, round, and reactive to light.  NEUROLOGICAL: No focal deficit present, he is alert and oriented to person, place, and time.  NECK: No carotid bruit.  LYMPHADENOPATHY: No cervical adenopathy.  CARDIOVASCULAR: Normal rate and regular rhythm, normal pulses, normal heart sounds, no murmur heard.  PULMONARY: Pulmonary effort is normal, normal breath sounds, lungs clear to auscultation bilaterally.  ABDOMINAL: Bowel sounds are normal, abdomen is soft, there is no abdominal tenderness, there is no guarding.  MUSCULOSKELETAL: No swelling or deformity, neck supple, no tenderness, no edema in right lower leg, no edema in left lower leg. Increased thoracic kyphosis  SKIN: Skin is warm and dry.  PSYCHIATRIC: Mood normal, thought content normal.         Previously completed labs, imaging studies and medical records were reviewed as part of this office visit.     Assessment/Plan:         ICD-10-CM    1. Essential hypertension  I10 hydroCHLOROthiazide  (HYDRODIURIL ) 12.5 mg Oral Tablet     potassium chloride  (MICRO K) 10 mEq Oral Capsule, Sustained Release     valsartan  (DIOVAN ) 80 mg Oral Tablet     CBC/DIFF     COMPREHENSIVE METABOLIC PANEL, NON-FASTING     LIPID PANEL     THYROID  STIMULATING HORMONE (SENSITIVE TSH)      2. S/P lumbar fusion  Z98.1 VITAMIN D 25 TOTAL      3. Degenerative disc disease, lumbar   M51.369 THYROID  STIMULATING HORMONE (SENSITIVE  TSH)     VITAMIN D 25 TOTAL      4. Chronic thoracic back pain  M54.6 THYROID  STIMULATING HORMONE (SENSITIVE TSH)    G89.29       5. Mixed hyperlipidemia  E78.2 COMPREHENSIVE METABOLIC PANEL, NON-FASTING     LIPID PANEL      6. Prediabetes  R73.03 COMPREHENSIVE METABOLIC PANEL, NON-FASTING     HGA1C (HEMOGLOBIN A1C WITH EST AVG GLUCOSE)      7. Prostate cancer screening  Z12.5 PSA SCREENING      8. Inappropriate diet and eating habits  Z72.4 VITAMIN D 25 TOTAL          Assessment & Plan  Thoracic back pain and lumbar degenerative disc disease, status post lumbar fusion  Persistent thoracic pain with lumbar degenerative disc disease post-fusion. Previous physical therapy worsened pain. Differential includes degenerative changes and movement-related issues.  - Await evaluation by Dr. Ilah on 06/18/2024,  - Discussed consideration for potential x-rays and flexion/extension x-rays.  - Consider referral to new physiatrist for potential injections post-evaluation.    Essential hypertension  Blood pressure generally well-controlled. Recent elevation likely due to early morning timing and caffeine.  - Continue Valsartan  80 mg in the morning and 40 mg in the evening, Hydrochlorothiazide  12.5 mg daily.  - Refilled prescriptions for Valsartan , Hydrochlorothiazide , and Potassium.    Prostate cancer screening  PSA screening is due.  - Ordered PSA test.       Depression screening is negative. PHQ 2 Total: 0       Return in about 6 months (around 12/13/2024) for In Person Visit - CDM.    Kethan Papadopoulos, DO     You can see your note(s) in [facility]. It is common for you to encounter certain medical terminology which may be unfamiliar to you. You might see results before your provider does so please give at least 2 business days for review. Please have this understanding, that NOT all abnormal results are significant. Our office will contact you for any urgent or emergent action if  necessary. If you have any questions or concerns, feel free to send a MyChart message or call the office. Please call with any new or concerning symptoms.     This note was created with assistance from [employer] via capture of conversational audio. Consent was obtained from the patient and all parties present prior to recording.           [1] No Known Allergies

## 2024-06-15 NOTE — Nursing Note (Signed)
 The influenza vaccine was not given because the patient/caregiver declined.

## 2024-12-14 ENCOUNTER — Ambulatory Visit (RURAL_HEALTH_CENTER): Payer: Self-pay | Admitting: Family Medicine
